# Patient Record
Sex: Male | Born: 1935 | Race: White | Hispanic: No | Marital: Married | Smoking: Former smoker
Health system: Southern US, Community
[De-identification: ages and names within clinical notes are randomized; demographics above are authoritative.]

---

## 2004-06-12 ENCOUNTER — Ambulatory Visit: Payer: Self-pay | Admitting: Rheumatology

## 2005-01-21 ENCOUNTER — Ambulatory Visit: Payer: Self-pay | Admitting: Cardiology

## 2005-05-10 ENCOUNTER — Ambulatory Visit: Payer: Self-pay | Admitting: Gastroenterology

## 2005-05-29 ENCOUNTER — Other Ambulatory Visit: Payer: Self-pay

## 2005-05-29 ENCOUNTER — Ambulatory Visit: Payer: Self-pay | Admitting: Gastroenterology

## 2005-06-05 ENCOUNTER — Ambulatory Visit: Payer: Self-pay | Admitting: Gastroenterology

## 2005-11-26 ENCOUNTER — Other Ambulatory Visit: Payer: Self-pay

## 2005-11-26 ENCOUNTER — Ambulatory Visit: Payer: Self-pay | Admitting: Urology

## 2005-12-02 ENCOUNTER — Ambulatory Visit: Payer: Self-pay | Admitting: Urology

## 2007-05-16 ENCOUNTER — Other Ambulatory Visit: Payer: Self-pay

## 2007-05-17 ENCOUNTER — Inpatient Hospital Stay: Payer: Self-pay | Admitting: Internal Medicine

## 2007-05-25 ENCOUNTER — Ambulatory Visit: Payer: Self-pay | Admitting: Internal Medicine

## 2007-08-04 ENCOUNTER — Other Ambulatory Visit: Payer: Self-pay

## 2007-08-04 ENCOUNTER — Inpatient Hospital Stay: Payer: Self-pay | Admitting: Cardiology

## 2007-08-05 ENCOUNTER — Other Ambulatory Visit: Payer: Self-pay

## 2007-11-05 ENCOUNTER — Ambulatory Visit: Payer: Self-pay | Admitting: Cardiology

## 2007-11-30 ENCOUNTER — Other Ambulatory Visit: Payer: Self-pay

## 2007-11-30 ENCOUNTER — Ambulatory Visit: Payer: Self-pay | Admitting: Cardiology

## 2009-11-26 ENCOUNTER — Inpatient Hospital Stay: Payer: Self-pay | Admitting: Internal Medicine

## 2010-01-16 ENCOUNTER — Ambulatory Visit: Payer: Self-pay | Admitting: Cardiology

## 2010-01-18 ENCOUNTER — Ambulatory Visit: Payer: Self-pay | Admitting: Cardiology

## 2010-05-03 ENCOUNTER — Inpatient Hospital Stay: Payer: Self-pay | Admitting: Internal Medicine

## 2011-02-06 ENCOUNTER — Ambulatory Visit: Payer: Self-pay | Admitting: Gastroenterology

## 2011-02-08 LAB — PATHOLOGY REPORT

## 2011-04-18 LAB — COMPREHENSIVE METABOLIC PANEL
Albumin: 4 g/dL (ref 3.4–5.0)
Alkaline Phosphatase: 53 U/L (ref 50–136)
Anion Gap: 8 (ref 7–16)
BUN: 25 mg/dL — ABNORMAL HIGH (ref 7–18)
Calcium, Total: 9.1 mg/dL (ref 8.5–10.1)
Co2: 29 mmol/L (ref 21–32)
Creatinine: 1.21 mg/dL (ref 0.60–1.30)
Glucose: 95 mg/dL (ref 65–99)
SGOT(AST): 23 U/L (ref 15–37)
SGPT (ALT): 26 U/L
Sodium: 143 mmol/L (ref 136–145)
Total Protein: 7 g/dL (ref 6.4–8.2)

## 2011-04-18 LAB — CBC WITH DIFFERENTIAL/PLATELET
Basophil %: 0.4 %
Eosinophil #: 0.2 10*3/uL (ref 0.0–0.7)
HCT: 39.6 % — ABNORMAL LOW (ref 40.0–52.0)
HGB: 13.5 g/dL (ref 13.0–18.0)
Lymphocyte #: 0.7 10*3/uL — ABNORMAL LOW (ref 1.0–3.6)
Lymphocyte %: 15 %
MCHC: 34 g/dL (ref 32.0–36.0)
MCV: 91 fL (ref 80–100)
Monocyte %: 9.4 %
Neutrophil #: 3.3 10*3/uL (ref 1.4–6.5)
RBC: 4.37 10*6/uL — ABNORMAL LOW (ref 4.40–5.90)
RDW: 15.1 % — ABNORMAL HIGH (ref 11.5–14.5)

## 2011-04-18 LAB — TROPONIN I: Troponin-I: <0.02 ng/mL

## 2011-04-18 LAB — CK TOTAL AND CKMB (NOT AT ARMC): CK, Total: 133 U/L (ref 35–232)

## 2011-04-18 LAB — PROTIME-INR
INR: 1.7
Prothrombin Time: 19.8 secs — ABNORMAL HIGH (ref 11.5–14.7)

## 2011-04-19 LAB — CK TOTAL AND CKMB (NOT AT ARMC): CK, Total: 94 U/L (ref 35–232)

## 2011-04-19 LAB — TROPONIN I: Troponin-I: <0.02 ng/mL

## 2011-04-19 LAB — TSH: Thyroid Stimulating Horm: 3.49 u[IU]/mL

## 2011-04-19 LAB — PRO B NATRIURETIC PEPTIDE: B-Type Natriuretic Peptide: 298 pg/mL (ref 0–450)

## 2011-04-20 LAB — CBC WITH DIFFERENTIAL/PLATELET
Basophil #: 0 10*3/uL (ref 0.0–0.1)
Basophil %: 0.4 %
Eosinophil #: 0.2 10*3/uL (ref 0.0–0.7)
HCT: 38.8 % — ABNORMAL LOW (ref 40.0–52.0)
HGB: 13.1 g/dL (ref 13.0–18.0)
Lymphocyte #: 0.9 10*3/uL — ABNORMAL LOW (ref 1.0–3.6)
MCH: 30.5 pg (ref 26.0–34.0)
MCHC: 33.8 g/dL (ref 32.0–36.0)
MCV: 90 fL (ref 80–100)
Monocyte %: 10.4 %
Neutrophil #: 3.6 10*3/uL (ref 1.4–6.5)
RDW: 15 % — ABNORMAL HIGH (ref 11.5–14.5)
WBC: 5.2 10*3/uL (ref 3.8–10.6)

## 2011-04-20 LAB — URINALYSIS, COMPLETE
Bacteria: NONE SEEN
Bilirubin,UR: NEGATIVE
Glucose,UR: NEGATIVE mg/dL (ref 0–75)
Leukocyte Esterase: NEGATIVE
Nitrite: NEGATIVE
RBC,UR: 1 /HPF (ref 0–5)
Squamous Epithelial: <1

## 2011-04-20 LAB — BASIC METABOLIC PANEL
Anion Gap: 9 (ref 7–16)
Calcium, Total: 9 mg/dL (ref 8.5–10.1)
Co2: 29 mmol/L (ref 21–32)
EGFR (African American): >60
EGFR (Non-African Amer.): 60 — ABNORMAL LOW
Glucose: 81 mg/dL (ref 65–99)
Osmolality: 290 (ref 275–301)
Sodium: 145 mmol/L (ref 136–145)

## 2011-04-20 LAB — CK TOTAL AND CKMB (NOT AT ARMC)
CK, Total: 72 U/L (ref 35–232)
CK, Total: 91 U/L (ref 35–232)
CK-MB: 0.7 ng/mL (ref 0.5–3.6)
CK-MB: 1.3 ng/mL (ref 0.5–3.6)

## 2011-04-21 ENCOUNTER — Inpatient Hospital Stay: Payer: Self-pay | Admitting: Internal Medicine

## 2011-04-21 LAB — BASIC METABOLIC PANEL
BUN: 21 mg/dL — ABNORMAL HIGH (ref 7–18)
Calcium, Total: 9 mg/dL (ref 8.5–10.1)
Chloride: 106 mmol/L (ref 98–107)
Co2: 29 mmol/L (ref 21–32)
Creatinine: 1.25 mg/dL (ref 0.60–1.30)
EGFR (Non-African Amer.): 60 — ABNORMAL LOW

## 2011-04-21 LAB — PROTIME-INR
INR: 2.1
Prothrombin Time: 23.8 secs — ABNORMAL HIGH (ref 11.5–14.7)

## 2011-04-26 ENCOUNTER — Ambulatory Visit: Payer: Self-pay | Admitting: Cardiology

## 2011-10-01 ENCOUNTER — Ambulatory Visit: Payer: Self-pay | Admitting: Neurology

## 2011-11-20 ENCOUNTER — Ambulatory Visit: Payer: Self-pay | Admitting: Physician Assistant

## 2013-02-20 ENCOUNTER — Emergency Department: Payer: Self-pay | Admitting: Internal Medicine

## 2013-02-20 LAB — PROTIME-INR
INR: 5.4
Prothrombin Time: 46.7 secs — ABNORMAL HIGH (ref 11.5–14.7)

## 2013-03-02 ENCOUNTER — Emergency Department: Payer: Self-pay | Admitting: Emergency Medicine

## 2013-07-31 ENCOUNTER — Inpatient Hospital Stay (HOSPITAL_COMMUNITY)
Admission: EM | Admit: 2013-07-31 | Discharge: 2013-08-03 | DRG: 068 | Disposition: A | Discharge status: Home / Self Care | Payer: Medicare Other | Attending: Internal Medicine | Admitting: Internal Medicine

## 2013-07-31 ENCOUNTER — Inpatient Hospital Stay (HOSPITAL_COMMUNITY): Payer: Medicare Other

## 2013-07-31 ENCOUNTER — Emergency Department (HOSPITAL_COMMUNITY): Payer: Medicare Other

## 2013-07-31 ENCOUNTER — Encounter (HOSPITAL_COMMUNITY): Payer: Self-pay | Admitting: Emergency Medicine

## 2013-07-31 DIAGNOSIS — I10 Essential (primary) hypertension: Secondary | ICD-10-CM

## 2013-07-31 DIAGNOSIS — I6509 Occlusion and stenosis of unspecified vertebral artery: Principal | ICD-10-CM | POA: Diagnosis present

## 2013-07-31 DIAGNOSIS — I671 Cerebral aneurysm, nonruptured: Secondary | ICD-10-CM | POA: Diagnosis present

## 2013-07-31 DIAGNOSIS — R791 Abnormal coagulation profile: Secondary | ICD-10-CM | POA: Diagnosis present

## 2013-07-31 DIAGNOSIS — E039 Hypothyroidism, unspecified: Secondary | ICD-10-CM | POA: Diagnosis present

## 2013-07-31 DIAGNOSIS — K449 Diaphragmatic hernia without obstruction or gangrene: Secondary | ICD-10-CM | POA: Diagnosis present

## 2013-07-31 DIAGNOSIS — I2789 Other specified pulmonary heart diseases: Secondary | ICD-10-CM | POA: Diagnosis present

## 2013-07-31 DIAGNOSIS — R55 Syncope and collapse: Secondary | ICD-10-CM

## 2013-07-31 DIAGNOSIS — R001 Bradycardia, unspecified: Secondary | ICD-10-CM

## 2013-07-31 DIAGNOSIS — Z860100 Personal history of colon polyps, unspecified: Secondary | ICD-10-CM

## 2013-07-31 DIAGNOSIS — M069 Rheumatoid arthritis, unspecified: Secondary | ICD-10-CM | POA: Diagnosis present

## 2013-07-31 DIAGNOSIS — I480 Paroxysmal atrial fibrillation: Secondary | ICD-10-CM | POA: Diagnosis present

## 2013-07-31 DIAGNOSIS — G4700 Insomnia, unspecified: Secondary | ICD-10-CM | POA: Diagnosis present

## 2013-07-31 DIAGNOSIS — F0390 Unspecified dementia without behavioral disturbance: Secondary | ICD-10-CM | POA: Diagnosis present

## 2013-07-31 DIAGNOSIS — Z87891 Personal history of nicotine dependence: Secondary | ICD-10-CM

## 2013-07-31 DIAGNOSIS — I729 Aneurysm of unspecified site: Secondary | ICD-10-CM | POA: Diagnosis present

## 2013-07-31 DIAGNOSIS — I2510 Atherosclerotic heart disease of native coronary artery without angina pectoris: Secondary | ICD-10-CM | POA: Diagnosis present

## 2013-07-31 DIAGNOSIS — I495 Sick sinus syndrome: Secondary | ICD-10-CM | POA: Diagnosis present

## 2013-07-31 DIAGNOSIS — I639 Cerebral infarction, unspecified: Secondary | ICD-10-CM

## 2013-07-31 DIAGNOSIS — E785 Hyperlipidemia, unspecified: Secondary | ICD-10-CM | POA: Diagnosis present

## 2013-07-31 DIAGNOSIS — I6350 Cerebral infarction due to unspecified occlusion or stenosis of unspecified cerebral artery: Secondary | ICD-10-CM

## 2013-07-31 DIAGNOSIS — I25810 Atherosclerosis of coronary artery bypass graft(s) without angina pectoris: Secondary | ICD-10-CM

## 2013-07-31 DIAGNOSIS — I440 Atrioventricular block, first degree: Secondary | ICD-10-CM | POA: Diagnosis present

## 2013-07-31 DIAGNOSIS — Z9181 History of falling: Secondary | ICD-10-CM

## 2013-07-31 DIAGNOSIS — I4891 Unspecified atrial fibrillation: Secondary | ICD-10-CM | POA: Diagnosis present

## 2013-07-31 DIAGNOSIS — Z7901 Long term (current) use of anticoagulants: Secondary | ICD-10-CM

## 2013-07-31 DIAGNOSIS — R471 Dysarthria and anarthria: Secondary | ICD-10-CM | POA: Diagnosis present

## 2013-07-31 LAB — RAPID URINE DRUG SCREEN, HOSP PERFORMED
Amphetamines: NOT DETECTED
Barbiturates: NOT DETECTED
Benzodiazepines: NOT DETECTED
Cocaine: NOT DETECTED
Opiates: NOT DETECTED
Tetrahydrocannabinol: NOT DETECTED

## 2013-07-31 LAB — URINALYSIS, ROUTINE W REFLEX MICROSCOPIC
Bilirubin Urine: NEGATIVE
Bilirubin Urine: NEGATIVE
Glucose, UA: NEGATIVE mg/dL
Glucose, UA: NEGATIVE mg/dL
Hgb urine dipstick: NEGATIVE
Hgb urine dipstick: NEGATIVE
Ketones, ur: NEGATIVE mg/dL
Ketones, ur: 15 mg/dL — AB
LEUKOCYTES UA: NEGATIVE
NITRITE: NEGATIVE
Nitrite: NEGATIVE
Protein, ur: NEGATIVE mg/dL
Protein, ur: NEGATIVE mg/dL
SPECIFIC GRAVITY, URINE: 1.015 (ref 1.005–1.030)
Specific Gravity, Urine: 1.013 (ref 1.005–1.030)
Urobilinogen, UA: 0.2 mg/dL (ref 0.0–1.0)
Urobilinogen, UA: 0.2 mg/dL (ref 0.0–1.0)
pH: 6.5 (ref 5.0–8.0)
pH: 6.5 (ref 5.0–8.0)

## 2013-07-31 LAB — CBC
HCT: 36.9 % — ABNORMAL LOW (ref 39.0–52.0)
Hemoglobin: 12.2 g/dL — ABNORMAL LOW (ref 13.0–17.0)
MCH: 30.1 pg (ref 26.0–34.0)
MCHC: 33.1 g/dL (ref 30.0–36.0)
MCV: 91.1 fL (ref 78.0–100.0)
Platelets: 140 10*3/uL — ABNORMAL LOW (ref 150–400)
RBC: 4.05 MIL/uL — ABNORMAL LOW (ref 4.22–5.81)
RDW: 14.4 % (ref 11.5–15.5)
WBC: 4.2 10*3/uL (ref 4.0–10.5)

## 2013-07-31 LAB — I-STAT CHEM 8, ED
BUN: 22 mg/dL (ref 6–23)
CALCIUM ION: 1.19 mmol/L (ref 1.13–1.30)
CREATININE: 1.2 mg/dL (ref 0.50–1.35)
Chloride: 105 mEq/L (ref 96–112)
Glucose, Bld: 81 mg/dL (ref 70–99)
HCT: 34 % — ABNORMAL LOW (ref 39.0–52.0)
Hemoglobin: 11.6 g/dL — ABNORMAL LOW (ref 13.0–17.0)
Potassium: 3.8 mEq/L (ref 3.7–5.3)
SODIUM: 141 meq/L (ref 137–147)
TCO2: 26 mmol/L (ref 0–100)

## 2013-07-31 LAB — DIFFERENTIAL
Basophils Absolute: 0 10*3/uL (ref 0.0–0.1)
Basophils Relative: 0 % (ref 0–1)
Eosinophils Absolute: 0.1 10*3/uL (ref 0.0–0.7)
Eosinophils Relative: 3 % (ref 0–5)
Lymphocytes Relative: 17 % (ref 12–46)
Lymphs Abs: 0.7 10*3/uL (ref 0.7–4.0)
Monocytes Absolute: 0.4 10*3/uL (ref 0.1–1.0)
Monocytes Relative: 10 % (ref 3–12)
Neutro Abs: 3 10*3/uL (ref 1.7–7.7)
Neutrophils Relative %: 70 % (ref 43–77)

## 2013-07-31 LAB — GLUCOSE, CAPILLARY
GLUCOSE-CAPILLARY: 96 mg/dL (ref 70–99)
GLUCOSE-CAPILLARY: 103 mg/dL — AB (ref 70–99)

## 2013-07-31 LAB — COMPREHENSIVE METABOLIC PANEL
ALT: 26 U/L (ref 0–53)
AST: 21 U/L (ref 0–37)
Albumin: 3.5 g/dL (ref 3.5–5.2)
Alkaline Phosphatase: 66 U/L (ref 39–117)
BUN: 24 mg/dL — ABNORMAL HIGH (ref 6–23)
CO2: 24 mEq/L (ref 19–32)
Calcium: 9 mg/dL (ref 8.4–10.5)
Chloride: 106 mEq/L (ref 96–112)
Creatinine, Ser: 1.11 mg/dL (ref 0.50–1.35)
GFR calc Af Amer: 71 mL/min — ABNORMAL LOW (ref >90)
GFR calc non Af Amer: 61 mL/min — ABNORMAL LOW (ref >90)
Glucose, Bld: 83 mg/dL (ref 70–99)
Potassium: 4 mEq/L (ref 3.7–5.3)
Sodium: 143 mEq/L (ref 137–147)
Total Bilirubin: 0.3 mg/dL (ref 0.3–1.2)
Total Protein: 6.2 g/dL (ref 6.0–8.3)

## 2013-07-31 LAB — CBG MONITORING, ED: GLUCOSE-CAPILLARY: 82 mg/dL (ref 70–99)

## 2013-07-31 LAB — MAGNESIUM: Magnesium: 1.8 mg/dL (ref 1.5–2.5)

## 2013-07-31 LAB — I-STAT TROPONIN, ED: Troponin i, poc: 0 ng/mL (ref 0.00–0.08)

## 2013-07-31 LAB — TROPONIN I
Troponin I: <0.3 ng/mL (ref <0.30)
Troponin I: <0.3 ng/mL (ref <0.30)

## 2013-07-31 LAB — URINE MICROSCOPIC-ADD ON

## 2013-07-31 LAB — TSH: TSH: 0.888 u[IU]/mL (ref 0.350–4.500)

## 2013-07-31 LAB — APTT: aPTT: 42 seconds — ABNORMAL HIGH (ref 24–37)

## 2013-07-31 LAB — PRO B NATRIURETIC PEPTIDE: Pro B Natriuretic peptide (BNP): 357.7 pg/mL (ref 0–450)

## 2013-07-31 LAB — PROTIME-INR
INR: 2.71 — ABNORMAL HIGH (ref 0.00–1.49)
Prothrombin Time: 27.8 seconds — ABNORMAL HIGH (ref 11.6–15.2)

## 2013-07-31 MED ORDER — ENOXAPARIN SODIUM 40 MG/0.4ML ~~LOC~~ SOLN: 40.0000 mg | SUBCUTANEOUS | Status: DC

## 2013-07-31 MED ORDER — ZOLPIDEM TARTRATE 5 MG PO TABS
5.0000 mg | ORAL_TABLET | Freq: Every day | ORAL | Status: DC
  Administered 2013-07-31 – 2013-08-02 (×3): 5 mg via ORAL
  Filled 2013-07-31 (×3): qty 1

## 2013-07-31 MED ORDER — LORATADINE 10 MG PO TABS
10.0000 mg | ORAL_TABLET | Freq: Every day | ORAL | Status: DC
  Administered 2013-07-31 – 2013-08-02 (×3): 10 mg via ORAL
  Filled 2013-07-31 (×4): qty 1

## 2013-07-31 MED ORDER — AMLODIPINE BESYLATE 10 MG PO TABS
10.0000 mg | ORAL_TABLET | Freq: Every day | ORAL | Status: DC
  Administered 2013-08-01 – 2013-08-03 (×3): 10 mg via ORAL
  Filled 2013-07-31 (×4): qty 1

## 2013-07-31 MED ORDER — WARFARIN - PHARMACIST DOSING INPATIENT
Freq: Every day | Status: DC
  Administered 2013-07-31: 19:00:00

## 2013-07-31 MED ORDER — LEVOTHYROXINE SODIUM 175 MCG PO TABS
175.0000 ug | ORAL_TABLET | Freq: Every day | ORAL | Status: DC
  Administered 2013-08-01 – 2013-08-03 (×3): 175 ug via ORAL
  Filled 2013-07-31 (×5): qty 1

## 2013-07-31 MED ORDER — ASPIRIN 325 MG PO TABS
325.0000 mg | ORAL_TABLET | Freq: Every day | ORAL | Status: DC
  Administered 2013-07-31: 325 mg via ORAL
  Filled 2013-07-31: qty 1

## 2013-07-31 MED ORDER — CELECOXIB 50 MG PO CAPS
50.0000 mg | ORAL_CAPSULE | ORAL | Status: DC
  Filled 2013-07-31: qty 1

## 2013-07-31 MED ORDER — OXYCODONE-ACETAMINOPHEN 5-325 MG PO TABS
1.0000 | ORAL_TABLET | Freq: Four times a day (QID) | ORAL | Status: DC | PRN
Start: 2013-07-31 — End: 2013-08-03

## 2013-07-31 MED ORDER — HYDRALAZINE HCL 20 MG/ML IJ SOLN: 2.0000 mg | Freq: Four times a day (QID) | INTRAMUSCULAR | Status: DC | PRN

## 2013-07-31 MED ORDER — LOSARTAN POTASSIUM 50 MG PO TABS
100.0000 mg | ORAL_TABLET | Freq: Every day | ORAL | Status: DC
  Administered 2013-08-01 – 2013-08-03 (×3): 100 mg via ORAL
  Filled 2013-07-31 (×5): qty 2

## 2013-07-31 MED ORDER — THIAMINE HCL 100 MG PO TABS
100.0000 mg | ORAL_TABLET | Freq: Every evening | ORAL | Status: DC
  Administered 2013-07-31 – 2013-08-02 (×3): 100 mg via ORAL
  Filled 2013-07-31 (×4): qty 1

## 2013-07-31 MED ORDER — CYANOCOBALAMIN 500 MCG PO TABS
500.0000 ug | ORAL_TABLET | Freq: Every evening | ORAL | Status: DC
  Administered 2013-07-31 – 2013-08-02 (×3): 500 ug via ORAL
  Filled 2013-07-31 (×4): qty 1

## 2013-07-31 MED ORDER — TRAZODONE HCL 50 MG PO TABS: 50.0000 mg | ORAL_TABLET | Freq: Every day | ORAL | Status: DC

## 2013-07-31 MED ORDER — DUTASTERIDE-TAMSULOSIN HCL 0.5-0.4 MG PO CAPS: 1.0000 | ORAL_CAPSULE | Freq: Every evening | ORAL | Status: DC

## 2013-07-31 MED ORDER — MONTELUKAST SODIUM 10 MG PO TABS
10.0000 mg | ORAL_TABLET | Freq: Every day | ORAL | Status: DC
  Administered 2013-07-31 – 2013-08-02 (×3): 10 mg via ORAL
  Filled 2013-07-31 (×4): qty 1

## 2013-07-31 MED ORDER — TAMSULOSIN HCL 0.4 MG PO CAPS
0.4000 mg | ORAL_CAPSULE | Freq: Every day | ORAL | Status: DC
  Administered 2013-08-01 – 2013-08-02 (×2): 0.4 mg via ORAL
  Filled 2013-07-31 (×4): qty 1

## 2013-07-31 MED ORDER — ASPIRIN 300 MG RE SUPP: 300.0000 mg | Freq: Every day | RECTAL | Status: DC

## 2013-07-31 MED ORDER — ATORVASTATIN CALCIUM 20 MG PO TABS
20.0000 mg | ORAL_TABLET | Freq: Every evening | ORAL | Status: DC
  Administered 2013-07-31 – 2013-08-02 (×3): 20 mg via ORAL
  Filled 2013-07-31 (×4): qty 1

## 2013-07-31 MED ORDER — SODIUM CHLORIDE 0.9 % IV SOLN
INTRAVENOUS | Status: DC
  Administered 2013-07-31 – 2013-08-02 (×3): via INTRAVENOUS

## 2013-07-31 MED ORDER — DONEPEZIL HCL 10 MG PO TABS
10.0000 mg | ORAL_TABLET | Freq: Every day | ORAL | Status: DC
  Administered 2013-07-31 – 2013-08-02 (×3): 10 mg via ORAL
  Filled 2013-07-31 (×4): qty 1

## 2013-07-31 MED ORDER — DUTASTERIDE 0.5 MG PO CAPS
0.5000 mg | ORAL_CAPSULE | Freq: Every evening | ORAL | Status: DC
  Administered 2013-08-01 – 2013-08-02 (×2): 0.5 mg via ORAL
  Filled 2013-07-31 (×4): qty 1

## 2013-07-31 MED ORDER — WARFARIN SODIUM 3 MG PO TABS
3.5000 mg | ORAL_TABLET | Freq: Every day | ORAL | Status: DC
  Administered 2013-07-31: 3.5 mg via ORAL
  Filled 2013-07-31 (×2): qty 1

## 2013-07-31 MED ORDER — SENNOSIDES-DOCUSATE SODIUM 8.6-50 MG PO TABS
1.0000 | ORAL_TABLET | Freq: Every evening | ORAL | Status: DC | PRN
  Filled 2013-07-31: qty 1

## 2013-07-31 MED ORDER — TRAZODONE HCL 100 MG PO TABS
200.0000 mg | ORAL_TABLET | Freq: Every day | ORAL | Status: DC
  Administered 2013-07-31 – 2013-08-02 (×3): 200 mg via ORAL
  Filled 2013-07-31 (×4): qty 2

## 2013-07-31 MED ORDER — FOLIC ACID 1 MG PO TABS
1.0000 mg | ORAL_TABLET | Freq: Every day | ORAL | Status: DC
  Administered 2013-07-31 – 2013-08-03 (×4): 1 mg via ORAL
  Filled 2013-07-31 (×5): qty 1

## 2013-07-31 NOTE — ED Notes (Addendum)
Wife reported Pt was last seen normal at 0845. Wife reported Pt was cooking waffles when she heard Pt develop slurred speech  At 0930. Wife then called 911

## 2013-07-31 NOTE — H&P (Addendum)
[facility] History and Physical  Charles Berg WIO:[id number] DOB: January 06, 1932 DOA: 07/31/2013  Referring physician:  PCP: Daniel Nones  Specialists:   Chief Complaint: Slurred speech, ataxia per wife  HPI: Charles Berg is a 80 y.o. male WM PMHx symptomatic bradycardia with pacer placement 2012/remove 2 weeks later secondary to infection, S/P subcutaneous loop recorder. States since 2012 has been on rate control medication amiodarone 200 mg BID, without side effect. On further questioning the daughter and wife admit that on numerous occasions patient has had presyncope and had to sit down, and has had one recent occurrence of syncope. Review of care everywhere; shows patient was last seen by Dr. Gerre Pebbles electrophysiology [facility] January 2010 at which time he was believed to be suffering from sick sinus syndrome. At that time patient's Cardizem and Toprol were discontinued and patient was continued on amiodarone for rhythm control. Was felt at that time patient may need a pacemaker instead of atrial fibrillation ablation. Presented to the emergency department with acute onset of difficulty speaking abnormal gait while cooking breakfast this morning. The patient, states, that he does not feel, weakness, in his extremities, but and had trouble walking and speaking. Patient, states, that the chest pain, shortness of breath, nausea, vomiting, diarrhea, headache, blurred vision, dizziness, fever, abdominal pain, or syncope. The patient states, that he has not had a stroke in the past. The patient does have some confusion as well. Patient wife provided a list showing that patient S/P cardioversion 08/05/2007 and 01/30/2008, S/P CardioNet monitor January 2010 and 06/08/2010      Review of Systems: The patient denies anorexia, fever, weight loss,, vision loss, decreased hearing, hoarseness, chest pain,  peripheral edema, balance deficits, hemoptysis, abdominal pain,  melena, hematochezia, severe indigestion/heartburn, hematuria, incontinence, genital sores, muscle weakness, suspicious skin lesions, transient blindness, difficulty walking, depression, unusual weight change, abnormal bleeding, enlarged lymph nodes, angioedema, and breast masses.    TRAVEL HISTORY:   Procedure MRI brain without contrast 07/31/2013 -No acute infarct.  - Likely Slow flow/occlusion of distal left vertebral artery.   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   Per care everywhere Dr. Gerre Pebbles (electrophysiology [facility])  October 2006 cardiac catheterization 40% LAD, 40% RCA, and 25% lesions in other vessels.   Antibiotics   Consultation    History reviewed. No pertinent past medical history. History reviewed. No pertinent past surgical history. Social History:  reports that he has quit smoking. He uses smokeless tobacco. He reports that he does not drink alcohol or use illicit drugs.  where does patient live--home, ALF, SNF? Home with wife   Allergies  Allergen Reactions  . Aspirin Other (See Comments)    Yeast infection in mouth  . Sulfa Antibiotics Other (See Comments)    unknown  . Penicillins Rash    History reviewed. No pertinent family history.   Prior to Admission medications   Not on File   Physical Exam: Filed Vitals:   07/31/13 1315 07/31/13 1330 07/31/13 1557 07/31/13 1749  BP: 153/62 152/60 156/71 138/61  Pulse: 50 49 50 44  Temp:   97.4 F (36.3 C) 97.5 F (36.4 C)  TempSrc:   Oral Oral  Resp: 15 14  16   Height:      Weight:      SpO2:    95%     General:  A./O. x4, NAD  Eyes: Pupils equal round reactive to light  and accommodation  Neck: Negative JVD, negative lymphadenopathy  Cardiovascular: Sinus bradycardia, negative murmurs rubs or gallops  Respiratory: Clear to auscultation bilateral  Abdomen: Soft, nontender, nondistended, plus bowel  sounds  Musculoskeletal: Implantable loop recorder present left upper chest wall  Neurologic: Cranial nerves II through XII intact, tongue/uvula midline, extremity strength 5/5, sensation intact throughout, negative Romberg, negative pronator drift, finger nose finger within normal limits bilateral, quick finger touchs within normal limits bilateral, balance on one leg within normal limits bilateral, ambulation within normal limits, ambulation on toe/heel within normal limit   Labs on Admission:  Basic Metabolic Panel:  Recent Labs Lab 07/31/13 1055 07/31/13 1100  NA 141 143  K 3.8 4.0  CL 105 106  CO2  --  24  GLUCOSE 81 83  BUN 22 24*  CREATININE 1.20 1.11  CALCIUM  --  9.0   Liver Function Tests:  Recent Labs Lab 07/31/13 1100  AST 21  ALT 26  ALKPHOS 66  BILITOT 0.3  PROT 6.2  ALBUMIN 3.5   No results found for this basename: LIPASE, AMYLASE,  in the last 168 hours No results found for this basename: AMMONIA,  in the last 168 hours CBC:  Recent Labs Lab 07/31/13 1055 07/31/13 1100  WBC  --  4.2  NEUTROABS  --  3.0  HGB 11.6* 12.2*  HCT 34.0* 36.9*  MCV  --  91.1  PLT  --  140*   Cardiac Enzymes: No results found for this basename: CKTOTAL, CKMB, CKMBINDEX, TROPONINI,  in the last 168 hours  BNP (last 3 results) No results found for this basename: PROBNP,  in the last 8760 hours CBG:  Recent Labs Lab 07/31/13 1100  GLUCAP 82    Radiological Exams on Admission: Dg Chest 2 View  07/31/2013   CLINICAL DATA:  Stroke.  EXAM: CHEST  2 VIEW  COMPARISON:  None.  FINDINGS: Mild hyperinflation. Lateral view degraded by patient arm position. Borderline loss of thoracic vertebral body height at multiple levels. Favored to be within normal variation.  Telemetry device projects over the left side of the mediastinum on the frontal.  Patient rotated right. Midline trachea. Normal heart size and mediastinal contours for age. No pleural effusion or pneumothorax.  Biapical pleural parenchymal scarring. Mild volume loss at the left lung base.  IMPRESSION: Hyperinflation and pleural parenchymal scarring.  No acute findings.   Electronically Signed   By: Jeronimo Greaves M.D.   On: 07/31/2013 15:23   Ct Head Wo Contrast  07/31/2013   CLINICAL DATA:  80 year old male code stroke. Left side facial droop. Initial encounter.  EXAM: CT HEAD WITHOUT CONTRAST  TECHNIQUE: Contiguous axial images were obtained from the base of the skull through the vertex without intravenous contrast.  COMPARISON:  None.  FINDINGS: No acute osseous abnormality identified. Visualized orbits and scalp soft tissues are within normal limits. Visualized paranasal sinuses and mastoids are clear.  Calcified atherosclerosis at the skull base. Cerebral volume is within normal limits for age. No ventriculomegaly. No midline shift, mass effect, or evidence of intracranial mass lesion. No suspicious intracranial vascular hyperdensity. No evidence of cortically based acute infarction identified. Gray-white matter differentiation is within normal limits throughout the brain. No acute intracranial hemorrhage identified.  IMPRESSION: Normal for age non contrast CT appearance of the brain.  Study discussed by telephone with Dr. Ritta Slot on 07/31/2013 at 11:04 .   Electronically Signed   By: Augusto Gamble M.D.   On: 07/31/2013 11:04  Mr Shirlee Latch Wo Contrast  07/31/2013   CLINICAL DATA:  80 year old male with slurred speech and facial droop. Difficulty walking. Code stroke.  EXAM: MRI HEAD WITHOUT CONTRAST  MRA HEAD WITHOUT CONTRAST  TECHNIQUE: Multiplanar, multiecho pulse sequences of the brain and surrounding structures were obtained without intravenous contrast. Angiographic images of the head were obtained using MRA technique without contrast.  COMPARISON:  Non contrast head CT 1048 hr the same day.  FINDINGS: MRI HEAD FINDINGS  Cerebral volume is within normal limits for age. No restricted diffusion to  suggest acute infarction. No midline shift, mass effect, evidence of mass lesion, ventriculomegaly, extra-axial collection or acute intracranial hemorrhage. Cervicomedullary junction and pituitary are within normal limits.  Decreased distal left vertebral artery flow void (series 11, image 14). Other Major intracranial vascular flow voids are preserved.  Small focus of chronic blood products in the right parietal lobe (series 9, image 121) without associated T2 or FLAIR hyperintensity. Elsewhere normal for age gray and white matter throughout the brain.  Negative visualized cervical spine. Normal bone marrow signal. Visualized orbit soft tissues are within normal limits. Minor paranasal sinus mucosal thickening. Mastoids are clear. Visible internal auditory structures appear normal. Visualized scalp soft tissues are within normal limits.  MRA HEAD FINDINGS  Absent antegrade flow in the distal left vertebral artery, instilled just before the vertebrobasilar junction. Preserved antegrade flow in the distal right vertebral artery. Normal right PICA origin.  The basilar artery is patent without stenosis. SCA and PCA origins are normal. Posterior communicating arteries are diminutive or absent. Bilateral PCA branches are within normal limits.  Antegrade flow in both ICA siphons. Tortuous distal cervical left ICA. No ICA stenosis. Normal ophthalmic artery origins. Unusual fenestrated appearance of the left ACA A1 segment. Normal carotid termini. Normal MCA and ACA origins otherwise. Anterior communicating artery remarkable for a 3 mm aneurysm directed inferiorly (series 5, images 89 and 90).  Other visualized ACA branches are within normal limits. Visualized bilateral MCA branches are within normal limits.  IMPRESSION: 1. No acute infarct. 2. 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 Favor chronic in light of no signal changes in the left cerebellum. 3. 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 4. Negative MRI appearance of the brain parenchyma aside from a small focus of chronic hemorrhage in the right parietal lobe. Study discussed by telephone with Dr. Ritta Slot on 07/31/2013 at 15:05 .   Electronically Signed   By: Augusto Gamble M.D.   On: 07/31/2013 15:05   Mr Brain Wo Contrast  07/31/2013   CLINICAL DATA:  80 year old male with slurred speech and facial droop. Difficulty walking. Code stroke.  EXAM: MRI HEAD WITHOUT CONTRAST  MRA HEAD WITHOUT CONTRAST  TECHNIQUE: Multiplanar, multiecho pulse sequences of the brain and surrounding structures were obtained without intravenous contrast. Angiographic images of the head were obtained using MRA technique without contrast.  COMPARISON:  Non contrast head CT 1048 hr the same day.  FINDINGS: MRI HEAD FINDINGS  Cerebral volume is within normal limits for age. No restricted diffusion to suggest acute infarction. No midline shift, mass effect, evidence of mass lesion, ventriculomegaly, extra-axial collection or acute intracranial hemorrhage. Cervicomedullary junction and pituitary are within normal limits.  Decreased distal left vertebral artery flow void (series 11, image 14). Other Major intracranial vascular flow voids are preserved.  Small focus of chronic blood products in the right parietal lobe (series 9, image 121)  without associated T2 or FLAIR hyperintensity. Elsewhere normal for age gray and white matter throughout the brain.  Negative visualized cervical spine. Normal bone marrow signal. Visualized orbit soft tissues are within normal limits. Minor paranasal sinus mucosal thickening. Mastoids are clear. Visible internal auditory structures appear normal. Visualized scalp soft tissues are within normal limits.  MRA HEAD FINDINGS  Absent antegrade flow in the distal left vertebral artery, instilled just before the vertebrobasilar junction. Preserved antegrade flow in the distal right vertebral  artery. Normal right PICA origin.  The basilar artery is patent without stenosis. SCA and PCA origins are normal. Posterior communicating arteries are diminutive or absent. Bilateral PCA branches are within normal limits.  Antegrade flow in both ICA siphons. Tortuous distal cervical left ICA. No ICA stenosis. Normal ophthalmic artery origins. Unusual fenestrated appearance of the left ACA A1 segment. Normal carotid termini. Normal MCA and ACA origins otherwise. Anterior communicating artery remarkable for a 3 mm aneurysm directed inferiorly (series 5, images 89 and 90).  Other visualized ACA branches are within normal limits. Visualized bilateral MCA branches are within normal limits.  IMPRESSION: 1. No acute infarct. 2. 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 Favor chronic in light of no signal changes in the left cerebellum. 3. 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 4. Negative MRI appearance of the brain parenchyma aside from a small focus of chronic hemorrhage in the right parietal lobe. Study discussed by telephone with Dr. Ritta Slot on 07/31/2013 at 15:05 .   Electronically Signed   By: Augusto Gamble M.D.   On: 07/31/2013 15:05    EKG: Sinus bradycardia, first degree AV block, wide QRS, prolonged QT interval, RAD, Q waves 1, aVL (lateral infarct)  NOTE; care everywhere 04/24/2008 Dr. Gerre Pebbles reported his EKG showing, NSR, rate 36 with premature atrial contraction and no ST-T wave changes.    Assessment/Plan Active Problems:   Stroke   HTN (hypertension)   HLD (hyperlipidemia)   Rheumatoid arthritis   Unspecified hypothyroidism   Hiatal hernia   Personal history of colonic polyps   CAD (coronary artery disease) of artery bypass graft   Paroxysmal atrial fibrillation   Insomnia   Sick sinus syndrome   Aneurysm of unspecified site   Sick sinus syndrome? -Spoke with Dr. Anne Ng (cardiology)  recommended stopping Amiodarone, and agreed that patient probably will need a pacer  Paroxysmal atrial fibrillation -Patient currently in sinus bradycardia, with first degree AV block and wide QRS, prolonged QT interval -Continue Coumadin per pharmacy  HTN -Continue amlodipine 10 mg -Continue losartan 100 mg -Hydralazine IV when necessary SBP>160 or DBP> 100  Hypothyroidism -Obtain TSH and free T4  Coronary artery disease -Obtain troponin x3 -Obtain proBNP -Echocardiogram ordered  HLD -Obtain lipid panel  Rheumatoid arthritis -Continue patient's Percocet 5/325 mg Q6 hr PRN pain -Will hold gabapentin as patient and wife unsure why she is on this medication  Insomnia -Continue trazodone 200 mg QHS  Aneurysm -Reconsult neurology in the a.m.    Code Status: Full Family Communication: Family at bedside  Disposition Plan: Completion of cardiology work up   Time spent: 140 min   Drema Dallas [facility] Pager [phone]  If 7PM-7AM, please contact night-coverage [email or web address] Password [facility] 07/31/2013, 6:31 PM

## 2013-07-31 NOTE — Consult Note (Signed)
Reason for Consult: Bradycardia Referring Physician: [name] is an 80 y.o. male.  HPI: Charles Berg is a 80 year old male with a history of atrial fibrillation and hypertension. He presents after an episode of acute encephalopathy. This morning he was in his usual state of health. He was making waffles with his wife when he began to make some jerking motions with his arms and seemed unsteady on his feet. He did not speak. He did not pass out. His wife walked him into the next room and sat him in a chair. After several minutes where he was not responsive he began to speak again but had slurred speech. His wife was concerned about stroke. He was taken to the hospital. He had an MRI that showed some older strokes and was admitted to the neurology service. Charles Berg memory is blurry around the time of the event. He doesn't remember any heart palpitations.  He has not had a similar episode in the past, but did have a syncopal episode several years ago while standing in line at the bank. There was a prodrome during that event. He does have a past medical history of falls. He states that these are mechanical in nature. They generally occur when he is walking up or down stairs. He has not had a fall when walking on level ground.   He at one time 3 years ago had a pacemaker placed for slow heart rates. It became infected within 2 weeks and was removed. He was then referred to Dr Marcello Moores at [facility] who did not believe that he needed a pacemaker. He had a Medtronic Reveal placed instead. He is treated with amiodarone for his atrial fibrillation as well as coumadin for stroke prevention.    Since arrival here his heart rate has been as low as 38. He has remained asymptomatic.   History reviewed. No pertinent past medical history. -atrial fibrillation -bradycardia -pacemaker pocket infection -falls -mild non-obstructive CAD -cva  History reviewed. No pertinent past surgical history.  Cath  2006  History reviewed. No pertinent family history. No history of CAD, SCD  Social History:  reports that he has quit smoking. He uses smokeless tobacco. He reports that he does not drink alcohol or use illicit drugs.  Allergies:  Allergies  Allergen Reactions  . Aspirin Other (See Comments)    Yeast infection in mouth  . Sulfa Antibiotics Other (See Comments)    unknown  . Penicillins Rash    Medications: I have reviewed the patient's current medications.  Results for orders placed during the hospital encounter of 07/31/13 (from the past 48 hour(s))  I-STAT CHEM 8, ED     Status: Abnormal   Collection Time    07/31/13 10:55 AM      Result Value Ref Range   Sodium 141  137 - 147 mEq/L   Potassium 3.8  3.7 - 5.3 mEq/L   Chloride 105  96 - 112 mEq/L   BUN 22  6 - 23 mg/dL   Creatinine, Ser 1.20  0.50 - 1.35 mg/dL   Glucose, Bld 81  70 - 99 mg/dL   Calcium, Ion 1.19  1.13 - 1.30 mmol/L   TCO2 26  0 - 100 mmol/L   Hemoglobin 11.6 (*) 13.0 - 17.0 g/dL   HCT 34.0 (*) 39.0 - 52.0 %  CBG MONITORING, ED     Status: None   Collection Time    07/31/13 11:00 AM  Result Value Ref Range   Glucose-Capillary 82  70 - 99 mg/dL   Comment 1 Documented in Chart     Comment 2 Notify RN    PROTIME-INR     Status: Abnormal   Collection Time    07/31/13 11:00 AM      Result Value Ref Range   Prothrombin Time 27.8 (*) 11.6 - 15.2 seconds   INR 2.71 (*) 0.00 - 1.49  APTT     Status: Abnormal   Collection Time    07/31/13 11:00 AM      Result Value Ref Range   aPTT 42 (*) 24 - 37 seconds   Comment:            IF BASELINE aPTT IS ELEVATED,     SUGGEST PATIENT RISK ASSESSMENT     BE USED TO DETERMINE APPROPRIATE     ANTICOAGULANT THERAPY.  CBC     Status: Abnormal   Collection Time    07/31/13 11:00 AM      Result Value Ref Range   WBC 4.2  4.0 - 10.5 K/uL   RBC 4.05 (*) 4.22 - 5.81 MIL/uL   Hemoglobin 12.2 (*) 13.0 - 17.0 g/dL   HCT 36.9 (*) 39.0 - 52.0 %   MCV 91.1  78.0 -  100.0 fL   MCH 30.1  26.0 - 34.0 pg   MCHC 33.1  30.0 - 36.0 g/dL   RDW 14.4  11.5 - 15.5 %   Platelets 140 (*) 150 - 400 K/uL  DIFFERENTIAL     Status: None   Collection Time    07/31/13 11:00 AM      Result Value Ref Range   Neutrophils Relative % 70  43 - 77 %   Neutro Abs 3.0  1.7 - 7.7 K/uL   Lymphocytes Relative 17  12 - 46 %   Lymphs Abs 0.7  0.7 - 4.0 K/uL   Monocytes Relative 10  3 - 12 %   Monocytes Absolute 0.4  0.1 - 1.0 K/uL   Eosinophils Relative 3  0 - 5 %   Eosinophils Absolute 0.1  0.0 - 0.7 K/uL   Basophils Relative 0  0 - 1 %   Basophils Absolute 0.0  0.0 - 0.1 K/uL  COMPREHENSIVE METABOLIC PANEL     Status: Abnormal   Collection Time    07/31/13 11:00 AM      Result Value Ref Range   Sodium 143  137 - 147 mEq/L   Potassium 4.0  3.7 - 5.3 mEq/L   Chloride 106  96 - 112 mEq/L   CO2 24  19 - 32 mEq/L   Glucose, Bld 83  70 - 99 mg/dL   BUN 24 (*) 6 - 23 mg/dL   Creatinine, Ser 1.11  0.50 - 1.35 mg/dL   Calcium 9.0  8.4 - 10.5 mg/dL   Total Protein 6.2  6.0 - 8.3 g/dL   Albumin 3.5  3.5 - 5.2 g/dL   AST 21  0 - 37 U/L   ALT 26  0 - 53 U/L   Alkaline Phosphatase 66  39 - 117 U/L   Total Bilirubin 0.3  0.3 - 1.2 mg/dL   GFR calc non Af Amer 61 (*) >90 mL/min   GFR calc Af Amer 71 (*) >90 mL/min   Comment: (NOTE)     The eGFR has been calculated using the CKD EPI equation.     This calculation has not been validated in  all clinical situations.     eGFR's persistently <90 mL/min signify possible Chronic Kidney     Disease.  Randolm Idol, ED     Status: None   Collection Time    07/31/13 11:08 AM      Result Value Ref Range   Troponin i, poc 0.00  0.00 - 0.08 ng/mL   Comment 3            Comment: Due to the release kinetics of cTnI,     a negative result within the first hours     of the onset of symptoms does not rule out     myocardial infarction with certainty.     If myocardial infarction is still suspected,     repeat the test at appropriate  intervals.  URINE RAPID DRUG SCREEN (HOSP PERFORMED)     Status: None   Collection Time    07/31/13 12:29 PM      Result Value Ref Range   Opiates NONE DETECTED  NONE DETECTED   Cocaine NONE DETECTED  NONE DETECTED   Benzodiazepines NONE DETECTED  NONE DETECTED   Amphetamines NONE DETECTED  NONE DETECTED   Tetrahydrocannabinol NONE DETECTED  NONE DETECTED   Barbiturates NONE DETECTED  NONE DETECTED   Comment:            DRUG SCREEN FOR MEDICAL PURPOSES     ONLY.  IF CONFIRMATION IS NEEDED     FOR ANY PURPOSE, NOTIFY LAB     WITHIN 5 DAYS.                LOWEST DETECTABLE LIMITS     FOR URINE DRUG SCREEN     Drug Class       Cutoff (ng/mL)     Amphetamine      1000     Barbiturate      200     Benzodiazepine   163     Tricyclics       845     Opiates          300     Cocaine          300     THC              50  URINALYSIS, ROUTINE W REFLEX MICROSCOPIC     Status: Abnormal   Collection Time    07/31/13 12:29 PM      Result Value Ref Range   Color, Urine YELLOW  YELLOW   APPearance CLEAR  CLEAR   Specific Gravity, Urine 1.013  1.005 - 1.030   pH 6.5  5.0 - 8.0   Glucose, UA NEGATIVE  NEGATIVE mg/dL   Hgb urine dipstick NEGATIVE  NEGATIVE   Bilirubin Urine NEGATIVE  NEGATIVE   Ketones, ur NEGATIVE  NEGATIVE mg/dL   Protein, ur NEGATIVE  NEGATIVE mg/dL   Urobilinogen, UA 0.2  0.0 - 1.0 mg/dL   Nitrite NEGATIVE  NEGATIVE   Leukocytes, UA TRACE (*) NEGATIVE  URINE MICROSCOPIC-ADD ON     Status: Abnormal   Collection Time    07/31/13 12:29 PM      Result Value Ref Range   Squamous Epithelial / LPF FEW (*) RARE   WBC, UA 3-6  <3 WBC/hpf   Bacteria, UA RARE  RARE  TROPONIN I     Status: None   Collection Time    07/31/13  5:12 PM      Result Value Ref Range  Troponin I <0.30  <0.30 ng/mL   Comment:            Due to the release kinetics of cTnI,     a negative result within the first hours     of the onset of symptoms does not rule out     myocardial infarction with  certainty.     If myocardial infarction is still suspected,     repeat the test at appropriate intervals.  GLUCOSE, CAPILLARY     Status: None   Collection Time    07/31/13  6:23 PM      Result Value Ref Range   Glucose-Capillary 96  70 - 99 mg/dL  URINALYSIS, ROUTINE W REFLEX MICROSCOPIC     Status: Abnormal   Collection Time    07/31/13  7:11 PM      Result Value Ref Range   Color, Urine YELLOW  YELLOW   APPearance CLEAR  CLEAR   Specific Gravity, Urine 1.015  1.005 - 1.030   pH 6.5  5.0 - 8.0   Glucose, UA NEGATIVE  NEGATIVE mg/dL   Hgb urine dipstick NEGATIVE  NEGATIVE   Bilirubin Urine NEGATIVE  NEGATIVE   Ketones, ur 15 (*) NEGATIVE mg/dL   Protein, ur NEGATIVE  NEGATIVE mg/dL   Urobilinogen, UA 0.2  0.0 - 1.0 mg/dL   Nitrite NEGATIVE  NEGATIVE   Leukocytes, UA NEGATIVE  NEGATIVE   Comment: MICROSCOPIC NOT DONE ON URINES WITH NEGATIVE PROTEIN, BLOOD, LEUKOCYTES, NITRITE, OR GLUCOSE <1000 mg/dL.  MAGNESIUM     Status: None   Collection Time    07/31/13  8:10 PM      Result Value Ref Range   Magnesium 1.8  1.5 - 2.5 mg/dL  TSH     Status: None   Collection Time    07/31/13  8:10 PM      Result Value Ref Range   TSH 0.888  0.350 - 4.500 uIU/mL   Comment: Please note change in reference range.  PRO B NATRIURETIC PEPTIDE     Status: None   Collection Time    07/31/13  8:10 PM      Result Value Ref Range   Pro B Natriuretic peptide (BNP) 357.7  0 - 450 pg/mL    Dg Chest 2 View  07/31/2013   CLINICAL DATA:  Stroke.  EXAM: CHEST  2 VIEW  COMPARISON:  None.  FINDINGS: Mild hyperinflation. Lateral view degraded by patient arm position. Borderline loss of thoracic vertebral body height at multiple levels. Favored to be within normal variation.  Telemetry device projects over the left side of the mediastinum on the frontal.  Patient rotated right. Midline trachea. Normal heart size and mediastinal contours for age. No pleural effusion or pneumothorax. Biapical pleural  parenchymal scarring. Mild volume loss at the left lung base.  IMPRESSION: Hyperinflation and pleural parenchymal scarring.  No acute findings.   Electronically Signed   By: Abigail Miyamoto M.D.   On: 07/31/2013 15:23   Ct Head Wo Contrast  07/31/2013   CLINICAL DATA:  80 year old male code stroke. Left side facial droop. Initial encounter.  EXAM: CT HEAD WITHOUT CONTRAST  TECHNIQUE: Contiguous axial images were obtained from the base of the skull through the vertex without intravenous contrast.  COMPARISON:  None.  FINDINGS: No acute osseous abnormality identified. Visualized orbits and scalp soft tissues are within normal limits. Visualized paranasal sinuses and mastoids are clear.  Calcified atherosclerosis at the skull base. Cerebral volume is within  normal limits for age. No ventriculomegaly. No midline shift, mass effect, or evidence of intracranial mass lesion. No suspicious intracranial vascular hyperdensity. No evidence of cortically based acute infarction identified. Gray-white matter differentiation is within normal limits throughout the brain. No acute intracranial hemorrhage identified.  IMPRESSION: Normal for age non contrast CT appearance of the brain.  Study discussed by telephone with Dr. Roland Rack on 07/31/2013 at 11:04 .   Electronically Signed   By: Lars Pinks M.D.   On: 07/31/2013 11:04   Charles Jodene Nam Head Wo Contrast  07/31/2013   CLINICAL DATA:  80 year old male with slurred speech and facial droop. Difficulty walking. Code stroke.  EXAM: MRI HEAD WITHOUT CONTRAST  MRA HEAD WITHOUT CONTRAST  TECHNIQUE: Multiplanar, multiecho pulse sequences of the brain and surrounding structures were obtained without intravenous contrast. Angiographic images of the head were obtained using MRA technique without contrast.  COMPARISON:  Non contrast head CT 1048 hr the same day.  FINDINGS: MRI HEAD FINDINGS  Cerebral volume is within normal limits for age. No restricted diffusion to suggest acute  infarction. No midline shift, mass effect, evidence of mass lesion, ventriculomegaly, extra-axial collection or acute intracranial hemorrhage. Cervicomedullary junction and pituitary are within normal limits.  Decreased distal left vertebral artery flow void (series 11, image 14). Other Major intracranial vascular flow voids are preserved.  Small focus of chronic blood products in the right parietal lobe (series 9, image 121) without associated T2 or FLAIR hyperintensity. Elsewhere normal for age gray and white matter throughout the brain.  Negative visualized cervical spine. Normal bone marrow signal. Visualized orbit soft tissues are within normal limits. Minor paranasal sinus mucosal thickening. Mastoids are clear. Visible internal auditory structures appear normal. Visualized scalp soft tissues are within normal limits.  MRA HEAD FINDINGS  Absent antegrade flow in the distal left vertebral artery, instilled just before the vertebrobasilar junction. Preserved antegrade flow in the distal right vertebral artery. Normal right PICA origin.  The basilar artery is patent without stenosis. SCA and PCA origins are normal. Posterior communicating arteries are diminutive or absent. Bilateral PCA branches are within normal limits.  Antegrade flow in both ICA siphons. Tortuous distal cervical left ICA. No ICA stenosis. Normal ophthalmic artery origins. Unusual fenestrated appearance of the left ACA A1 segment. Normal carotid termini. Normal MCA and ACA origins otherwise. Anterior communicating artery remarkable for a 3 mm aneurysm directed inferiorly (series 5, images 89 and 90).  Other visualized ACA branches are within normal limits. Visualized bilateral MCA branches are within normal limits.  IMPRESSION: 1. No acute infarct. 2. 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 Favor chronic in light of no signal changes in the left cerebellum. 3. 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 4. Negative MRI appearance of the brain parenchyma aside from a small focus of chronic hemorrhage in the right parietal lobe. Study discussed by telephone with Dr. Roland Rack on 07/31/2013 at 15:05 .   Electronically Signed   By: Lars Pinks M.D.   On: 07/31/2013 15:05   Charles Brain Wo Contrast  07/31/2013   CLINICAL DATA:  80 year old male with slurred speech and facial droop. Difficulty walking. Code stroke.  EXAM: MRI HEAD WITHOUT CONTRAST  MRA HEAD WITHOUT CONTRAST  TECHNIQUE: Multiplanar, multiecho pulse sequences of the brain and surrounding structures were obtained without intravenous contrast. Angiographic images of the head were obtained using MRA technique without contrast.  COMPARISON:  Non  contrast head CT 1048 hr the same day.  FINDINGS: MRI HEAD FINDINGS  Cerebral volume is within normal limits for age. No restricted diffusion to suggest acute infarction. No midline shift, mass effect, evidence of mass lesion, ventriculomegaly, extra-axial collection or acute intracranial hemorrhage. Cervicomedullary junction and pituitary are within normal limits.  Decreased distal left vertebral artery flow void (series 11, image 14). Other Major intracranial vascular flow voids are preserved.  Small focus of chronic blood products in the right parietal lobe (series 9, image 121) without associated T2 or FLAIR hyperintensity. Elsewhere normal for age gray and white matter throughout the brain.  Negative visualized cervical spine. Normal bone marrow signal. Visualized orbit soft tissues are within normal limits. Minor paranasal sinus mucosal thickening. Mastoids are clear. Visible internal auditory structures appear normal. Visualized scalp soft tissues are within normal limits.  MRA HEAD FINDINGS  Absent antegrade flow in the distal left vertebral artery, instilled just before the vertebrobasilar junction. Preserved antegrade flow in the distal right vertebral artery. Normal  right PICA origin.  The basilar artery is patent without stenosis. SCA and PCA origins are normal. Posterior communicating arteries are diminutive or absent. Bilateral PCA branches are within normal limits.  Antegrade flow in both ICA siphons. Tortuous distal cervical left ICA. No ICA stenosis. Normal ophthalmic artery origins. Unusual fenestrated appearance of the left ACA A1 segment. Normal carotid termini. Normal MCA and ACA origins otherwise. Anterior communicating artery remarkable for a 3 mm aneurysm directed inferiorly (series 5, images 89 and 90).  Other visualized ACA branches are within normal limits. Visualized bilateral MCA branches are within normal limits.  IMPRESSION: 1. No acute infarct. 2. 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 Favor chronic in light of no signal changes in the left cerebellum. 3. 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 4. Negative MRI appearance of the brain parenchyma aside from a small focus of chronic hemorrhage in the right parietal lobe. Study discussed by telephone with Dr. Roland Rack on 07/31/2013 at 15:05 .   Electronically Signed   By: Lars Pinks M.D.   On: 07/31/2013 15:05   ECG- 1st degree AV block, IVCD sinus bradycardia  Review of Systems  All other systems reviewed and are negative.  Blood pressure 136/64, pulse 46, temperature 97.5 F (36.4 C), temperature source Oral, resp. rate 16, height '6\' 1"'  (1.854 m), weight 204 lb (92.534 kg), SpO2 96.00%. Physical Exam  Nursing note and vitals reviewed. Constitutional: He is oriented to person, place, and time. He appears well-developed and well-nourished. No distress.  Neck: No JVD present. No thyromegaly present.  Cardiovascular: Normal heart sounds and intact distal pulses.   Respiratory: Effort normal and breath sounds normal.  An implantable device is present over the left chest, subcutaneous  GI: Soft. Bowel sounds are  normal.  Musculoskeletal: He exhibits no edema.  Neurological: He is alert and oriented to person, place, and time.  Skin: He is not diaphoretic.    Assessment/Plan: Charles Peyton is a pleasant 80 year old male with a history of pre-syncope/syncopal events and previously asymptomatic bradycardia. He also has paroxysmal atrial fibrillation and is currently on amiodarone and coumadin. He does have a medtronic reveal monitor. Hopefully this is still active and may have recorded something during this event. It seems that at one point he was thought to have had sinus node dysfunction requiring a pacemaker. If we can find an arrhythmia, sinus arrest etc  that correlates with his event, he may need a pacemaker again. A note written by Dr Marcello Moores in 2013 notes that Charles Wolz had a cath in 2006 which showed mild non-obstructive disease.   Bradycardia -would stop amiodarone for now -telemetry -check TSH, potassium, magnesium, calcium  -would avoid AV nodal blocking agents  Syncope -TTE -telemetry -will try to interrogate his reveal monitor  AF -currently in sinus rhythm -coumadin    Javier Docker MD 07/31/2013, 9:14 PM

## 2013-07-31 NOTE — ED Notes (Signed)
PT grandaughter  Wants results results of CT of  head called to her.  # [phone] name Arma Heading

## 2013-07-31 NOTE — Consult Note (Signed)
Neurology Consultation Reason for Consult: Stroke Referring Physician: Joseph Art, C.  CC: Stroke  History is obtained from: Patient  HPI: Charles Berg is a 80 y.o. male with a history of atrial fibrillation on Coumadin who was in his normal state on awakening this morning. While making breakfast (waffles) he was speaking with his wife and she noticed that his speech become markedly slurred. She then called 911 out of concern for stroke. Initially 9:30 AM was page down, but this is when she noticed the abnormality she last saw him normal at 8:40 AM.  LKW: 8:40 a.m. tpa given?: no, mild symptoms, anticoagulated   ROS: A 14 point ROS was performed and is negative except as noted in the HPI.  Past medical history: Atrial fibrillation  Family History: Stroke  Social History: Tob: Previous smoker  Exam: Current vital signs: BP 156/71  Pulse 50  Temp(Src) 97.4 F (36.3 C) (Oral)  Resp 14  Ht 6\' 1"  (1.854 m)  Wt 92.534 kg (204 lb)  BMI 26.92 kg/m2  SpO2 99% Vital signs in last 24 hours: Temp:  [97.4 F (36.3 C)-97.7 F (36.5 C)] 97.4 F (36.3 C) (04/18 1557) Pulse Rate:  [41-60] 50 (04/18 1557) Resp:  [12-23] 14 (04/18 1330) BP: (119-156)/(53-100) 156/71 mmHg (04/18 1557) SpO2:  [99 %] 99 % (04/18 1041) Weight:  [92.534 kg (204 lb)] 92.534 kg (204 lb) (04/18 1041)  General: In bed, NAD  CV: Regular rate and rhythm Mental Status: Patient is awake, alert, oriented to person, place, month, year, and situation. Immediate and remote memory are intact. Patient is able to give a clear and coherent history. No signs of aphasia or neglect He is dysarthric Cranial Nerves: II: Visual Fields are full. Pupils are equal, round, and reactive to light.  Discs are difficult to visualize. III,IV, VI: EOMI without ptosis or diploplia.  V: Facial sensation is decreased on the left to pin VII: Facial movement is notable for left facial weakness VIII: hearing is intact to voice X:  Uvula elevates symmetrically XI: Shoulder shrug is symmetric. XII: tongue is midline without atrophy or fasciculations.  Motor: Tone is normal. Bulk is normal. 5/5 strength was present in all four extremities.  Sensory: Sensation is symmetric to light touch and temperature in the arms and legs. Deep Tendon Reflexes: 2+ and symmetric in the biceps and patellae.  Plantars: Toes are downgoing bilaterally.  Cerebellar: FNF and HKS are intact bilaterally Gait: Not assessed due to acute nature of evaluation and multiple medical monitors in ED setting.   I have reviewed labs in epic and the results pertinent to this consultation are:   I have reviewed the images obtained:CT head negative.   Impression: 80 year old male with sudden onset left facial droop left facial numbness and dysarthria. I am concerned for small infarct. Symptoms do seem to be slightly improving, and his INR is elevated and therefore I would not pursue TPA at this time. The symptoms are too mild to to pursue interventional therapy. He will need to be admitted to look for other possible causes of stroke in addition to his atrial fibrillation.   Recommendations: 1. HgbA1c, fasting lipid panel 2. MRI, MRA  of the brain without contrast 3. Frequent neuro checks 4. Echocardiogram 5. Carotid dopplers 6. Prophylactic therapy-coumadin 7. Risk factor modification 8. Telemetry monitoring 9. PT consult, OT consult, Speech consult    78, MD [facility] [phone]  If 7pm- 7am, please page neurology on call as listed in AMION.

## 2013-07-31 NOTE — Progress Notes (Signed)
Pt admitted from Ed from home with dx of

## 2013-07-31 NOTE — ED Provider Notes (Signed)
CSN: [id number]     Arrival date & time 07/31/13  1022 History   First MD Initiated Contact with Patient 07/31/13 1041     Chief Complaint  Patient presents with  . Code Stroke  . Bradycardia    An emergency department physician performed an initial assessment on this suspected stroke patient at 1030. (Consider location/radiation/quality/duration/timing/severity/associated sxs/prior Treatment) HPI Patient presents to the emergency department with acute onset of difficulty speaking abnormal gait while cooking breakfast this morning.  The patient, states, that he does not feel, weakness, in his extremities, but and had trouble walking and speaking.  Patient, states, that the chest pain, shortness of breath, nausea, vomiting, diarrhea, headache, blurred vision, dizziness, fever, abdominal pain, or syncope.  The patient states, that he has not had a stroke in the past.  The patient does have some confusion as well History reviewed. No pertinent past medical history. History reviewed. No pertinent past surgical history. History reviewed. No pertinent family history. History  Substance Use Topics  . Smoking status: Former Games developer  . Smokeless tobacco: Current User  . Alcohol Use: No    Review of Systems All other systems negative except as documented in the HPI. All pertinent positives and negatives as reviewed in the HPI.  Allergies  Review of patient's allergies indicates not on file.  Home Medications   Prior to Admission medications   Not on File   BP 152/60  Pulse 49  Temp(Src) 97.7 F (36.5 C) (Oral)  Resp 14  Ht 6\' 1"  (1.854 m)  Wt 204 lb (92.534 kg)  BMI 26.92 kg/m2  SpO2 99% Physical Exam  Nursing note and vitals reviewed. Constitutional: He is oriented to person, place, and time. He appears well-developed and well-nourished. No distress.  HENT:  Head: Normocephalic and atraumatic.  Mouth/Throat: Oropharynx is clear and moist.  Eyes: Pupils are equal, round, and  reactive to light.  Neck: Normal range of motion. Neck supple.  Cardiovascular: Normal rate, regular rhythm and normal heart sounds.  Exam reveals no gallop and no friction rub.   No murmur heard. Pulmonary/Chest: Effort normal and breath sounds normal.  Neurological: He is alert and oriented to person, place, and time. He exhibits normal muscle tone. Coordination normal.  Skin: Skin is warm and dry. No rash noted. No erythema.    ED Course  Procedures (including critical care time) Labs Review Labs Reviewed  PROTIME-INR - Abnormal; Notable for the following:    Prothrombin Time 27.8 (*)    INR 2.71 (*)    All other components within normal limits  APTT - Abnormal; Notable for the following:    aPTT 42 (*)    All other components within normal limits  CBC - Abnormal; Notable for the following:    RBC 4.05 (*)    Hemoglobin 12.2 (*)    HCT 36.9 (*)    Platelets 140 (*)    All other components within normal limits  COMPREHENSIVE METABOLIC PANEL - Abnormal; Notable for the following:    BUN 24 (*)    GFR calc non Af Amer 61 (*)    GFR calc Af Amer 71 (*)    All other components within normal limits  URINALYSIS, ROUTINE W REFLEX MICROSCOPIC - Abnormal; Notable for the following:    Leukocytes, UA TRACE (*)    All other components within normal limits  URINE MICROSCOPIC-ADD ON - Abnormal; Notable for the following:    Squamous Epithelial / LPF FEW (*)    All  other components within normal limits  I-STAT CHEM 8, ED - Abnormal; Notable for the following:    Hemoglobin 11.6 (*)    HCT 34.0 (*)    All other components within normal limits  DIFFERENTIAL  URINE RAPID DRUG SCREEN (HOSP PERFORMED)  CBG MONITORING, ED  I-STAT CHEM 8, ED  I-STAT TROPOININ, ED  I-STAT TROPOININ, ED    Imaging Review Ct Head Wo Contrast  07/31/2013   CLINICAL DATA:  80 year old male code stroke. Left side facial droop. Initial encounter.  EXAM: CT HEAD WITHOUT CONTRAST  TECHNIQUE: Contiguous axial  images were obtained from the base of the skull through the vertex without intravenous contrast.  COMPARISON:  None.  FINDINGS: No acute osseous abnormality identified. Visualized orbits and scalp soft tissues are within normal limits. Visualized paranasal sinuses and mastoids are clear.  Calcified atherosclerosis at the skull base. Cerebral volume is within normal limits for age. No ventriculomegaly. No midline shift, mass effect, or evidence of intracranial mass lesion. No suspicious intracranial vascular hyperdensity. No evidence of cortically based acute infarction identified. Gray-white matter differentiation is within normal limits throughout the brain. No acute intracranial hemorrhage identified.  IMPRESSION: Normal for age non contrast CT appearance of the brain.  Study discussed by telephone with Dr. Ritta Slot on 07/31/2013 at 11:04 .   Electronically Signed   By: Augusto Gamble M.D.   On: 07/31/2013 11:04     EKG Interpretation   Date/Time:  Saturday July 31 2013 10:38:23 EDT Ventricular Rate:  51 PR Interval:  243 QRS Duration: 128 QT Interval:  572 QTC Calculation: 527 R Axis:   105 Text Interpretation:  Sinus rhythm Prolonged PR interval Nonspecific  intraventricular conduction delay Lateral infarct, old Confirmed by  HARRISON  MD, FORREST ([id number]) on 07/31/2013 11:38:59 AM      I spoke with neurology, who will be down to see the patient as a code stroke.  The patient has declined TPA.  I spoke with the, [facility], who will admit the patient to the hospital for further evaluation and care.     Carlyle Dolly, PA-C 07/31/13 [phone]

## 2013-07-31 NOTE — ED Notes (Signed)
CBG 82 

## 2013-07-31 NOTE — ED Notes (Signed)
Notified [facility] to Activate CODE Stroke

## 2013-07-31 NOTE — Progress Notes (Signed)
ANTICOAGULATION CONSULT NOTE - Initial Consult  Pharmacy Consult for Warfarin Indication: Hx. Afib   Allergies  Allergen Reactions  . Aspirin Other (See Comments)    Yeast infection in mouth  . Sulfa Antibiotics Other (See Comments)    unknown  . Penicillins Rash   Patient Measurements: Height: 6\' 1"  (185.4 cm) Weight: 204 lb (92.534 kg) IBW/kg (Calculated) : 79.9  Vital Signs: Temp: 97.4 F (36.3 C) (04/18 1557) Temp src: Oral (04/18 1557) BP: 156/71 mmHg (04/18 1557) Pulse Rate: 50 (04/18 1557)  Labs:  Recent Labs  07/31/13 1055 07/31/13 1100  HGB 11.6* 12.2*  HCT 34.0* 36.9*  PLT  --  140*  APTT  --  42*  LABPROT  --  27.8*  INR  --  2.71*  CREATININE 1.20 1.11   Estimated Creatinine Clearance: 61 ml/min (by C-G formula based on Cr of 1.11).  Medications:  Prescriptions prior to admission  Medication Sig Dispense Refill  . amiodarone (PACERONE) 200 MG tablet Take 200 mg by mouth 2 (two) times daily.      08/02/13 amLODipine (NORVASC) 10 MG tablet Take 10 mg by mouth every morning.      Marland Kitchen atorvastatin (LIPITOR) 20 MG tablet Take 20 mg by mouth every evening.      . Calcium Citrate-Vitamin D (CITRACAL + D PO) Take 1 tablet by mouth 2 (two) times daily.      . celecoxib (CELEBREX) 50 MG capsule Take 50 mg by mouth 3 (three) times a week. Monday, Wednesday and Thursday      . cyanocobalamin 500 MCG tablet Take 500 mcg by mouth every evening.      . DONEPEZIL HCL PO Take 8 mg by mouth every evening.      . Dutasteride-Tamsulosin HCl (JALYN) 0.5-0.4 MG CAPS Take 1 tablet by mouth every evening.      . folic acid (FOLVITE) 1 MG tablet Take 1 mg by mouth every morning.      . gabapentin (NEURONTIN) 100 MG capsule Take 100-200 mg by mouth 2 (two) times daily. 1 capsule in the morning and two capsules in the evening      . HYDROCODONE-ACETAMINOPHEN PO Take 1 tablet by mouth every 6 (six) hours as needed (pain). 2/25      . Hydrocodone-Homatropine (HYCODAN PO) Take 5 mLs by  mouth every 6 (six) hours as needed (cough).      3/25 levothyroxine (SYNTHROID, LEVOTHROID) 175 MCG tablet Take 175 mcg by mouth daily before breakfast.      . loratadine (CLARITIN) 10 MG tablet Take 10 mg by mouth at bedtime.      Marland Kitchen losartan (COZAAR) 100 MG tablet Take 100 mg by mouth every morning.      . montelukast (SINGULAIR) 10 MG tablet Take 10 mg by mouth at bedtime.      . thiamine 100 MG tablet Take 100 mg by mouth every evening.      . traZODone (DESYREL) 50 MG tablet Take 200 mg by mouth at bedtime.      Marland Kitchen warfarin (COUMADIN) 1 MG tablet Take 1 mg by mouth every evening. Takes along with the 2.5mg  to equal 3.5mg       . warfarin (COUMADIN) 5 MG tablet Take 2.5 mg by mouth every evening. Takes along with a 1mg  tablet to equal 3.5mg       . Xylometazoline HCl (4-WAY NASAL SPRAY NA) Place 1 spray into both nostrils 4 (four) times daily as needed (congestion).      Marland Kitchen  zolpidem (AMBIEN) 5 MG tablet Take 5 mg by mouth at bedtime.       Assessment: 80 yo male admitted with stroke like symptoms.  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  He is on Amiodarone for his rhythm.  We have been asked to monitor his warfarin therapy while he is hospitalized.  Today his INR is 2.71 which is within desired goal range.  His H/H are stable and he has some mild thrombocytopenia.    HOME:  Total dose is 3.5mg  daily of Warfarin  Goal of Therapy:  INR 2-3 Monitor platelets by anticoagulation protocol: Yes   Plan:  - Will continue his home regimen of Warfarin 3.5mg  daily - Monitor for bleeding complications - Adjust dose based on INR - Daily PT/INR and CBC weekly  Nadara Mustard, PharmD., MS Clinical Pharmacist Pager:  [phone] Thank you for allowing pharmacy to be part of this patients care team. 07/31/2013,4:51 PM

## 2013-08-01 DIAGNOSIS — I6350 Cerebral infarction due to unspecified occlusion or stenosis of unspecified cerebral artery: Secondary | ICD-10-CM

## 2013-08-01 DIAGNOSIS — I440 Atrioventricular block, first degree: Secondary | ICD-10-CM

## 2013-08-01 DIAGNOSIS — I498 Other specified cardiac arrhythmias: Secondary | ICD-10-CM

## 2013-08-01 DIAGNOSIS — R55 Syncope and collapse: Secondary | ICD-10-CM

## 2013-08-01 DIAGNOSIS — I059 Rheumatic mitral valve disease, unspecified: Secondary | ICD-10-CM

## 2013-08-01 LAB — LIPID PANEL
CHOLESTEROL: 119 mg/dL (ref 0–200)
HDL: 64 mg/dL (ref >39)
LDL Cholesterol: 46 mg/dL (ref 0–99)
TRIGLYCERIDES: 44 mg/dL (ref <150)
Total CHOL/HDL Ratio: 1.9 RATIO
VLDL: 9 mg/dL (ref 0–40)

## 2013-08-01 LAB — HEMOGLOBIN A1C
HEMOGLOBIN A1C: 5 % (ref <5.7)
Mean Plasma Glucose: 97 mg/dL (ref <117)

## 2013-08-01 LAB — BASIC METABOLIC PANEL
BUN: 22 mg/dL (ref 6–23)
CHLORIDE: 107 meq/L (ref 96–112)
CO2: 25 mEq/L (ref 19–32)
CREATININE: 1 mg/dL (ref 0.50–1.35)
Calcium: 8.7 mg/dL (ref 8.4–10.5)
GFR calc Af Amer: 80 mL/min — ABNORMAL LOW (ref >90)
GFR calc non Af Amer: 69 mL/min — ABNORMAL LOW (ref >90)
Glucose, Bld: 100 mg/dL — ABNORMAL HIGH (ref 70–99)
Potassium: 4.3 mEq/L (ref 3.7–5.3)
Sodium: 142 mEq/L (ref 137–147)

## 2013-08-01 LAB — MAGNESIUM
MAGNESIUM: 1.9 mg/dL (ref 1.5–2.5)
Magnesium: 1.9 mg/dL (ref 1.5–2.5)

## 2013-08-01 LAB — PROTIME-INR
INR: 3.52 — AB (ref 0.00–1.49)
PROTHROMBIN TIME: 34 s — AB (ref 11.6–15.2)

## 2013-08-01 LAB — T4, FREE: Free T4: 1.64 ng/dL (ref 0.80–1.80)

## 2013-08-01 LAB — TROPONIN I: Troponin I: <0.3 ng/mL (ref <0.30)

## 2013-08-01 MED ORDER — CELECOXIB 100 MG PO CAPS
100.0000 mg | ORAL_CAPSULE | ORAL | Status: DC
  Administered 2013-08-02: 100 mg via ORAL
  Filled 2013-08-01: qty 1

## 2013-08-01 NOTE — Progress Notes (Signed)
Primary cardiologist: Dr. Maisie Fus ([facility])  Subjective:   Feels better today. No chest pain or palpitations.   Objective:   Temp:  [97.4 F (36.3 C)-98 F (36.7 C)] 97.8 F (36.6 C) (04/19 0656) Pulse Rate:  [41-60] 43 (04/19 0656) Resp:  [12-23] 12 (04/19 0656) BP: (119-156)/(52-100) 139/58 mmHg (04/19 0656) SpO2:  [92 %-99 %] 93 % (04/19 0656) Weight:  [204 lb (92.534 kg)] 204 lb (92.534 kg) (04/18 1041) Last BM Date: 07/30/13  Filed Weights   07/31/13 1041  Weight: 204 lb (92.534 kg)    Intake/Output Summary (Last 24 hours) at 08/01/13 0846 Last data filed at 08/01/13 0400  Gross per 24 hour  Intake    560 ml  Output    750 ml  Net   -190 ml    Telemetry: Sinus bradycardia as low as 40 BPM, no pauses.  Exam:  General: Appears comfortable.  Lungs: Clear, nonlabored.  Cardiac: RRR, no gallop.  Extremities: No edema.   Lab Results:  Basic Metabolic Panel:  Recent Labs Lab 07/31/13 1055 07/31/13 1100 07/31/13 2010 08/01/13 0635  NA 141 143  --   --   K 3.8 4.0  --   --   CL 105 106  --   --   CO2  --  24  --   --   GLUCOSE 81 83  --   --   BUN 22 24*  --   --   CREATININE 1.20 1.11  --   --   CALCIUM  --  9.0  --   --   MG  --   --  1.8 1.9    Liver Function Tests:  Recent Labs Lab 07/31/13 1100  AST 21  ALT 26  ALKPHOS 66  BILITOT 0.3  PROT 6.2  ALBUMIN 3.5    CBC:  Recent Labs Lab 07/31/13 1055 07/31/13 1100  WBC  --  4.2  HGB 11.6* 12.2*  HCT 34.0* 36.9*  MCV  --  91.1  PLT  --  140*    Cardiac Enzymes:  Recent Labs Lab 07/31/13 1712 07/31/13 2230 08/01/13 0635  TROPONINI <0.30 <0.30 <0.30    BNP:  Recent Labs  07/31/13 2010  PROBNP 357.7    Coagulation:  Recent Labs Lab 07/31/13 1100 08/01/13 0635  INR 2.71* 3.52*     Medications:   Scheduled Medications: . amLODipine  10 mg Oral Q0600  . atorvastatin  20 mg Oral QPM  . [START ON 08/02/2013] celecoxib  100 mg Oral Q M,W,F  . [START ON  08/02/2013] celecoxib  50 mg Oral Once per day on Mon Wed Fri  . cyanocobalamin  500 mcg Oral QPM  . donepezil  10 mg Oral QHS  . dutasteride  0.5 mg Oral QPM   And  . tamsulosin  0.4 mg Oral QPC supper  . folic acid  1 mg Oral Daily  . levothyroxine  175 mcg Oral QAC breakfast  . loratadine  10 mg Oral QHS  . losartan  100 mg Oral Daily  . montelukast  10 mg Oral QHS  . thiamine  100 mg Oral QPM  . traZODone  200 mg Oral QHS  . warfarin  3.5 mg Oral q1800  . Warfarin - Pharmacist Dosing Inpatient   Does not apply q1800  . zolpidem  5 mg Oral QHS    Infusions: . sodium chloride 100 mL/hr at 07/31/13 1719    PRN Medications: hydrALAZINE, oxyCODONE-acetaminophen, senna-docusate  Assessment:   1. Bradycardia, not clearly symptomatic or necessarily explanation for his presentation. Has been evaluated at [facility] in past (see consult note) and has Medtronic reveal monitor in place (2-3 years?). It may not be functioning any more.  2. History of PAF, on Coumadin. Currently in sinus rhythm. Amiodarone has been held for now.  3. 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 No chest pain and cardiac markers are negative.   Plan/Discussion:    Will ask EP to get Medtronic reveal monitor interrogated - it may not be functioning however. Follow telemetry. Off Amiodarone for now. No other rate lowering agents, although Aricept can also lead to bradycardia.   Jonelle Sidle, M.D., F.A.C.C.

## 2013-08-01 NOTE — Progress Notes (Addendum)
ANTICOAGULATION CONSULT NOTE - Follow Up  Pharmacy Consult for Warfarin Indication: Hx. Afib   Allergies  Allergen Reactions  . Aspirin Other (See Comments)    Yeast infection in mouth  . Sulfa Antibiotics Other (See Comments)    unknown  . Penicillins Rash   Patient Measurements: Height: 6\' 1"  (185.4 cm) Weight: 204 lb (92.534 kg) IBW/kg (Calculated) : 79.9  Vital Signs: Temp: 97.8 F (36.6 C) (04/19 0656) Temp src: Oral (04/19 0656) BP: 139/58 mmHg (04/19 0656) Pulse Rate: 43 (04/19 0656)  Labs:  Recent Labs  07/31/13 1055 07/31/13 1100 07/31/13 1712 07/31/13 2230 08/01/13 0635  HGB 11.6* 12.2*  --   --   --   HCT 34.0* 36.9*  --   --   --   PLT  --  140*  --   --   --   APTT  --  42*  --   --   --   LABPROT  --  27.8*  --   --  34.0*  INR  --  2.71*  --   --  3.52*  CREATININE 1.20 1.11  --   --   --   TROPONINI  --   --  <0.30 <0.30 <0.30   Estimated Creatinine Clearance: 61 ml/min (by C-G formula based on Cr of 1.11).  Medications:  Prescriptions prior to admission  Medication Sig Dispense Refill  . amiodarone (PACERONE) 200 MG tablet Take 200 mg by mouth 2 (two) times daily.      08/03/13 amLODipine (NORVASC) 10 MG tablet Take 10 mg by mouth every morning.      Marland Kitchen atorvastatin (LIPITOR) 20 MG tablet Take 20 mg by mouth every evening.      . Calcium Citrate-Vitamin D (CITRACAL + D PO) Take 1 tablet by mouth 2 (two) times daily.      . celecoxib (CELEBREX) 50 MG capsule Take 50 mg by mouth 3 (three) times a week. Monday, Wednesday and Thursday      . cyanocobalamin 500 MCG tablet Take 500 mcg by mouth every evening.      . DONEPEZIL HCL PO Take 8 mg by mouth every evening.      . Dutasteride-Tamsulosin HCl (JALYN) 0.5-0.4 MG CAPS Take 1 tablet by mouth every evening.      . folic acid (FOLVITE) 1 MG tablet Take 1 mg by mouth every morning.      . gabapentin (NEURONTIN) 100 MG capsule Take 100-200 mg by mouth 2 (two) times daily. 1 capsule in the morning and two  capsules in the evening      . HYDROCODONE-ACETAMINOPHEN PO Take 1 tablet by mouth every 6 (six) hours as needed (pain). 2/25      . Hydrocodone-Homatropine (HYCODAN PO) Take 5 mLs by mouth every 6 (six) hours as needed (cough).      3/25 levothyroxine (SYNTHROID, LEVOTHROID) 175 MCG tablet Take 175 mcg by mouth daily before breakfast.      . loratadine (CLARITIN) 10 MG tablet Take 10 mg by mouth at bedtime.      Marland Kitchen losartan (COZAAR) 100 MG tablet Take 100 mg by mouth every morning.      . montelukast (SINGULAIR) 10 MG tablet Take 10 mg by mouth at bedtime.      . thiamine 100 MG tablet Take 100 mg by mouth every evening.      . traZODone (DESYREL) 50 MG tablet Take 200 mg by mouth at bedtime.      Marland Kitchen warfarin (  COUMADIN) 1 MG tablet Take 1 mg by mouth every evening. Takes along with the 2.5mg  to equal 3.5mg       . warfarin (COUMADIN) 5 MG tablet Take 2.5 mg by mouth every evening. Takes along with a 1mg  tablet to equal 3.5mg       . Xylometazoline HCl (4-WAY NASAL SPRAY NA) Place 1 spray into both nostrils 4 (four) times daily as needed (congestion).      zolpidem (AMBIEN) 5 MG tablet Take 5 mg by mouth at bedtime.       Assessment: 80 yo male admitted with stroke like symptoms.  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  He is on Amiodarone for his rhythm.  We have been asked to monitor his warfarin therapy while he is hospitalized.  Today his INR increased significantly on home regimen and is 3.52.  Would have expected a decrease considering his Amiodarone is on hold.  His H/H are stable yesterday and he is without noted bleeding today.     HOME:  Total dose is 3.5mg  daily of Warfarin  Goal of Therapy:  INR 2-3 Monitor platelets by anticoagulation protocol: Yes   Plan:  - Will hold his Warfarin therapy today - Monitor for bleeding complications - Adjust dose based on INR - Daily PT/INR and CBC weekly  76, PharmD., MS Clinical Pharmacist Pager:  [phone] Thank you for allowing pharmacy to be part of this patients care team. 08/01/2013,10:39 AM

## 2013-08-01 NOTE — Evaluation (Signed)
Physical Therapy Evaluation Patient Details Name: Charles Berg MRN: [id number] DOB: 08/09/1931 Today's Date: 08/01/2013   History of Present Illness  80 y.o. male admitted with dx of stroke and bradycardia.  Clinical Impression  Pt admitted with stroke and bradycardia. Pt is at baseline mobility level.  No PT intervention indicated.  PT signing off.      Follow Up Recommendations No PT follow up    Equipment Recommendations  None recommended by PT    Recommendations for Other Services       Precautions / Restrictions Precautions Precautions: None      Mobility  Bed Mobility Overal bed mobility: Modified Independent                Transfers Overall transfer level: Modified independent Equipment used: None                Ambulation/Gait Ambulation/Gait assistance: Supervision Ambulation Distance (Feet): 250 Feet Assistive device: None Gait Pattern/deviations: WFL(Within Functional Limits) Gait velocity: decreased   General Gait Details: mildly unsteady but no LOB  Stairs            Wheelchair Mobility    Modified Rankin (Stroke Patients Only) Modified Rankin (Stroke Patients Only) Pre-Morbid Rankin Score: No significant disability Modified Rankin: No significant disability     Balance Overall balance assessment: Modified Independent                                           Pertinent Vitals/Pain 0/10    Home Living Family/patient expects to be discharged to:: Private residence Living Arrangements: Spouse/significant other Available Help at Discharge: Family;Available 24 hours/day Type of Home: House Home Access: Stairs to enter Entrance Stairs-Rails: None Entrance Stairs-Number of Steps: 2 Home Layout: One level Home Equipment: Cane - single point      Prior Function Level of Independence: Independent               Hand Dominance        Extremity/Trunk Assessment   Upper Extremity  Assessment: Overall WFL for tasks assessed           Lower Extremity Assessment: Overall WFL for tasks assessed         Communication   Communication: No difficulties  Cognition Arousal/Alertness: Awake/alert   Overall Cognitive Status: Within Functional Limits for tasks assessed                      General Comments      Exercises        Assessment/Plan    PT Assessment Patent does not need any further PT services  PT Diagnosis     PT Problem List    PT Treatment Interventions     PT Goals (Current goals can be found in the Care Plan section) Acute Rehab PT Goals Patient Stated Goal: home PT Goal Formulation: No goals set, d/c therapy    Frequency     Barriers to discharge        Co-evaluation               End of Session Equipment Utilized During Treatment: Gait belt Activity Tolerance: Patient tolerated treatment well Patient left: in bed;with family/visitor present;with call bell/phone within reach Nurse Communication: Mobility status         Time: 1324-4010 PT Time Calculation (min): 23 min   Charges:  PT Evaluation $Initial PT Evaluation Tier I: 1 Procedure PT Treatments $Gait Training: 8-22 mins   PT G Codes:          Danelle Berry 08/01/2013, 12:32 PM  Aida Raider, PT  Office # [phone] Pager [phone]

## 2013-08-01 NOTE — Progress Notes (Signed)
Bilateral carotid artery duplex:  1-39% ICA stenosis.  Vertebral artery flow is antegrade.     

## 2013-08-01 NOTE — Progress Notes (Signed)
[facility] PROGRESS NOTE  Charles Berg NTI:[id number] DOB: Aug 31, 1931 DOA: 07/31/2013 PCP: Daniel Nones  Assessment/Plan:  Sick sinus syndrome?  -Continue to hold amiodarone per cardiology. Spoke with Dr. Anne Ng (cardiology) on 4/18  - 4/19 he is scheduled for echocardiogram and carotid Doppler study today.  Paroxysmal atrial fibrillation  -Patient currently in sinus bradycardia.   -Continue Coumadin per pharmacy   HTN  -Continue amlodipine 10 mg  -Continue losartan 100 mg  -Hydralazine IV when necessary SBP>160 or DBP> 100   Hypothyroidism  -TSH; within normal limits  -continue Synthroid 175 mcg daily -free T4; pending   Coronary artery disease  -Obtain troponin x3 (negative) -Obtain proBNP mildly elevated at 357  -Echocardiogram ordered   HLD  -lipid panel; within AHA guideline   Rheumatoid arthritis  -Continue patient's Percocet 5/325 mg Q6 hr PRN pain  -Will hold gabapentin as patient and wife unsure why she is on this medication   Insomnia  -Continue trazodone 200 mg QHS   Aneurysm  -Patient has newly diagnosed aneurysm 3 mm; considered low risk for rupture will need reimaging in 6 months.      Code Status: Full  Family Communication: Family at bedside  Disposition Plan: Completion of cardiology work up    Procedure  MRI brain without contrast 07/31/2013  -No acute infarct.  - Likely Slow flow/occlusion of distal left vertebral artery.  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  Per care everywhere Dr. Gerre Pebbles (electrophysiology [facility])  October 2006 cardiac catheterization 40% LAD, 40% RCA, and 25% lesions in other vessels.    Antibiotics     Consultation Dr Franky Macho Kohan(Cardiology)      HPI/Subjective: Charles Berg is a 80 y.o. male WM PMHx symptomatic bradycardia with pacer placement 2012/remove 2 weeks later secondary to infection, S/P  subcutaneous loop recorder. States since 2012 has been on rate control medication amiodarone 200 mg BID, without side effect. On further questioning the daughter and wife admit that on numerous occasions patient has had presyncope and had to sit down, and has had one recent occurrence of syncope. Review of care everywhere; shows patient was last seen by Dr. Gerre Pebbles electrophysiology [facility] January 2010 at which time he was believed to be suffering from sick sinus syndrome. At that time patient's Cardizem and Toprol were discontinued and patient was continued on amiodarone for rhythm control. Was felt at that time patient may need a pacemaker instead of atrial fibrillation ablation.  Presented to the emergency department with acute onset of difficulty speaking abnormal gait while cooking breakfast this morning. The patient, states, that he does not feel, weakness, in his extremities, but and had trouble walking and speaking. Patient, states, that the chest pain, shortness of breath, nausea, vomiting, diarrhea, headache, blurred vision, dizziness, fever, abdominal pain, or syncope. The patient states, that he has not had a stroke in the past. The patient does have some confusion as well. Patient wife provided a list showing that patient S/P cardioversion 08/05/2007 and 01/30/2008, S/P CardioNet monitor January 2010 and 06/08/2010  4/19 patient's HR overnight high 30s- low 40s asymptomatic (note patient has not been out of bed), negative dizziness/ CP/ SOB.   Objective: Filed Vitals:   07/31/13 2352 08/01/13 0146 08/01/13 0402 08/01/13 0656  BP: 146/57 143/59 131/52 139/58  Pulse: 41 44 46 43  Temp: 98 F (36.7 C) 98 F (36.7 C) 97.7 F (36.5 C)  97.8 F (36.6 C)  TempSrc: Oral Oral Oral Oral  Resp: 14 16 16 12   Height:      Weight:      SpO2: 93% 92% 92% 93%    Intake/Output Summary (Last 24 hours) at 08/01/13 0851 Last data filed at 08/01/13 0400  Gross per 24 hour   Intake    560 ml  Output    750 ml  Net   -190 ml   Filed Weights   07/31/13 1041  Weight: 92.534 kg (204 lb)    Exam:   General:  A./O. x4, NAD  Cardiovascular: Bradycardic, regular rhythm, negative murmurs rubs or gallops  Respiratory: Clear to auscultation bilateral  Abdomen: Soft, nontender, nondistended, plus bowel sound  Musculoskeletal: Negative pedal edema   Data Reviewed: Basic Metabolic Panel:  Recent Labs Lab 07/31/13 1055 07/31/13 1100 07/31/13 2010 08/01/13 0635  NA 141 143  --   --   K 3.8 4.0  --   --   CL 105 106  --   --   CO2  --  24  --   --   GLUCOSE 81 83  --   --   BUN 22 24*  --   --   CREATININE 1.20 1.11  --   --   CALCIUM  --  9.0  --   --   MG  --   --  1.8 1.9   Liver Function Tests:  Recent Labs Lab 07/31/13 1100  AST 21  ALT 26  ALKPHOS 66  BILITOT 0.3  PROT 6.2  ALBUMIN 3.5   No results found for this basename: LIPASE, AMYLASE,  in the last 168 hours No results found for this basename: AMMONIA,  in the last 168 hours CBC:  Recent Labs Lab 07/31/13 1055 07/31/13 1100  WBC  --  4.2  NEUTROABS  --  3.0  HGB 11.6* 12.2*  HCT 34.0* 36.9*  MCV  --  91.1  PLT  --  140*   Cardiac Enzymes:  Recent Labs Lab 07/31/13 1712 07/31/13 2230 08/01/13 0635  TROPONINI <0.30 <0.30 <0.30   BNP (last 3 results)  Recent Labs  07/31/13 2010  PROBNP 357.7   CBG:  Recent Labs Lab 07/31/13 1100 07/31/13 1823 07/31/13 2150  GLUCAP 82 96 103*    No results found for this or any previous visit (from the past 240 hour(s)).   Studies: Dg Chest 2 View  07/31/2013   CLINICAL DATA:  Stroke.  EXAM: CHEST  2 VIEW  COMPARISON:  None.  FINDINGS: Mild hyperinflation. Lateral view degraded by patient arm position. Borderline loss of thoracic vertebral body height at multiple levels. Favored to be within normal variation.  Telemetry device projects over the left side of the mediastinum on the frontal.  Patient rotated right.  Midline trachea. Normal heart size and mediastinal contours for age. No pleural effusion or pneumothorax. Biapical pleural parenchymal scarring. Mild volume loss at the left lung base.  IMPRESSION: Hyperinflation and pleural parenchymal scarring.  No acute findings.   Electronically Signed   By: 08/02/2013 M.D.   On: 07/31/2013 15:23   Ct Head Wo Contrast  07/31/2013   CLINICAL DATA:  80 year old male code stroke. Left side facial droop. Initial encounter.  EXAM: CT HEAD WITHOUT CONTRAST  TECHNIQUE: Contiguous axial images were obtained from the base of the skull through the vertex without intravenous contrast.  COMPARISON:  None.  FINDINGS: No acute osseous abnormality identified. Visualized orbits and scalp  soft tissues are within normal limits. Visualized paranasal sinuses and mastoids are clear.  Calcified atherosclerosis at the skull base. Cerebral volume is within normal limits for age. No ventriculomegaly. No midline shift, mass effect, or evidence of intracranial mass lesion. No suspicious intracranial vascular hyperdensity. No evidence of cortically based acute infarction identified. Gray-white matter differentiation is within normal limits throughout the brain. No acute intracranial hemorrhage identified.  IMPRESSION: Normal for age non contrast CT appearance of the brain.  Study discussed by telephone with Dr. Ritta SlotMCNEILL KIRKPATRICK on 07/31/2013 at 11:04 .   Electronically Signed   By: Augusto GambleLee  Hall M.D.   On: 07/31/2013 11:04   Mr Maxine GlennMra Head Wo Contrast  07/31/2013   CLINICAL DATA:  80 year old male with slurred speech and facial droop. Difficulty walking. Code stroke.  EXAM: MRI HEAD WITHOUT CONTRAST  MRA HEAD WITHOUT CONTRAST  TECHNIQUE: Multiplanar, multiecho pulse sequences of the brain and surrounding structures were obtained without intravenous contrast. Angiographic images of the head were obtained using MRA technique without contrast.  COMPARISON:  Non contrast head CT 1048 hr the same day.   FINDINGS: MRI HEAD FINDINGS  Cerebral volume is within normal limits for age. No restricted diffusion to suggest acute infarction. No midline shift, mass effect, evidence of mass lesion, ventriculomegaly, extra-axial collection or acute intracranial hemorrhage. Cervicomedullary junction and pituitary are within normal limits.  Decreased distal left vertebral artery flow void (series 11, image 14). Other Major intracranial vascular flow voids are preserved.  Small focus of chronic blood products in the right parietal lobe (series 9, image 121) without associated T2 or FLAIR hyperintensity. Elsewhere normal for age gray and white matter throughout the brain.  Negative visualized cervical spine. Normal bone marrow signal. Visualized orbit soft tissues are within normal limits. Minor paranasal sinus mucosal thickening. Mastoids are clear. Visible internal auditory structures appear normal. Visualized scalp soft tissues are within normal limits.  MRA HEAD FINDINGS  Absent antegrade flow in the distal left vertebral artery, instilled just before the vertebrobasilar junction. Preserved antegrade flow in the distal right vertebral artery. Normal right PICA origin.  The basilar artery is patent without stenosis. SCA and PCA origins are normal. Posterior communicating arteries are diminutive or absent. Bilateral PCA branches are within normal limits.  Antegrade flow in both ICA siphons. Tortuous distal cervical left ICA. No ICA stenosis. Normal ophthalmic artery origins. Unusual fenestrated appearance of the left ACA A1 segment. Normal carotid termini. Normal MCA and ACA origins otherwise. Anterior communicating artery remarkable for a 3 mm aneurysm directed inferiorly (series 5, images 89 and 90).  Other visualized ACA branches are within normal limits. Visualized bilateral MCA branches are within normal limits.  IMPRESSION: 1. No acute infarct. 2. 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 Favor chronic in  light of no signal changes in the left cerebellum. 3. 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 4. Negative MRI appearance of the brain parenchyma aside from a small focus of chronic hemorrhage in the right parietal lobe. Study discussed by telephone with Dr. Ritta SlotMcNeill Kirkpatrick on 07/31/2013 at 15:05 .   Electronically Signed   By: Augusto GambleLee  Hall M.D.   On: 07/31/2013 15:05   Mr Brain Wo Contrast  07/31/2013   CLINICAL DATA:  80 year old male with slurred speech and facial droop. Difficulty walking. Code stroke.  EXAM: MRI HEAD WITHOUT CONTRAST  MRA HEAD WITHOUT CONTRAST  TECHNIQUE: Multiplanar, multiecho pulse sequences of the brain  and surrounding structures were obtained without intravenous contrast. Angiographic images of the head were obtained using MRA technique without contrast.  COMPARISON:  Non contrast head CT 1048 hr the same day.  FINDINGS: MRI HEAD FINDINGS  Cerebral volume is within normal limits for age. No restricted diffusion to suggest acute infarction. No midline shift, mass effect, evidence of mass lesion, ventriculomegaly, extra-axial collection or acute intracranial hemorrhage. Cervicomedullary junction and pituitary are within normal limits.  Decreased distal left vertebral artery flow void (series 11, image 14). Other Major intracranial vascular flow voids are preserved.  Small focus of chronic blood products in the right parietal lobe (series 9, image 121) without associated T2 or FLAIR hyperintensity. Elsewhere normal for age gray and white matter throughout the brain.  Negative visualized cervical spine. Normal bone marrow signal. Visualized orbit soft tissues are within normal limits. Minor paranasal sinus mucosal thickening. Mastoids are clear. Visible internal auditory structures appear normal. Visualized scalp soft tissues are within normal limits.  MRA HEAD FINDINGS  Absent antegrade flow in the distal left vertebral artery,  instilled just before the vertebrobasilar junction. Preserved antegrade flow in the distal right vertebral artery. Normal right PICA origin.  The basilar artery is patent without stenosis. SCA and PCA origins are normal. Posterior communicating arteries are diminutive or absent. Bilateral PCA branches are within normal limits.  Antegrade flow in both ICA siphons. Tortuous distal cervical left ICA. No ICA stenosis. Normal ophthalmic artery origins. Unusual fenestrated appearance of the left ACA A1 segment. Normal carotid termini. Normal MCA and ACA origins otherwise. Anterior communicating artery remarkable for a 3 mm aneurysm directed inferiorly (series 5, images 89 and 90).  Other visualized ACA branches are within normal limits. Visualized bilateral MCA branches are within normal limits.  IMPRESSION: 1. No acute infarct. 2. 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 Favor chronic in light of no signal changes in the left cerebellum. 3. 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 4. Negative MRI appearance of the brain parenchyma aside from a small focus of chronic hemorrhage in the right parietal lobe. Study discussed by telephone with Dr. Ritta Slot on 07/31/2013 at 15:05 .   Electronically Signed   By: Augusto Gamble M.D.   On: 07/31/2013 15:05    Scheduled Meds: . amLODipine  10 mg Oral Q0600  . atorvastatin  20 mg Oral QPM  . [START ON 08/02/2013] celecoxib  100 mg Oral Q M,W,F  . [START ON 08/02/2013] celecoxib  50 mg Oral Once per day on Mon Wed Fri  . cyanocobalamin  500 mcg Oral QPM  . donepezil  10 mg Oral QHS  . dutasteride  0.5 mg Oral QPM   And  . tamsulosin  0.4 mg Oral QPC supper  . folic acid  1 mg Oral Daily  . levothyroxine  175 mcg Oral QAC breakfast  . loratadine  10 mg Oral QHS  . losartan  100 mg Oral Daily  . montelukast  10 mg Oral QHS  . thiamine  100 mg Oral QPM  . traZODone  200 mg Oral QHS  .  warfarin  3.5 mg Oral q1800  . Warfarin - Pharmacist Dosing Inpatient   Does not apply q1800  . zolpidem  5 mg Oral QHS   Continuous Infusions: . sodium chloride 100 mL/hr at 07/31/13 1719    Active Problems:   Stroke   HTN (hypertension)   HLD (hyperlipidemia)  Rheumatoid arthritis   Unspecified hypothyroidism   Hiatal hernia   Personal history of colonic polyps   CAD (coronary artery disease) of artery bypass graft   Paroxysmal atrial fibrillation   Insomnia   Sick sinus syndrome   Aneurysm of unspecified site    Time spent: 40 min   Drema Dallas  [facility] Pager [phone]. If 7PM-7AM, please contact night-coverage at [email or web address], password [facility] 08/01/2013, 8:51 AM  LOS: 1 day

## 2013-08-01 NOTE — Progress Notes (Signed)
Patient ID: Charles Berg, male   DOB: 05/22/1931, 80 y.o.   MRN: [id number] EP Attending  Asked by Dr. Diona Browner to interogate ILR. His device was interogated by me. His device is working normally and battery status "good". He has had no recent episodes of atrial fibrillation or significant bradycardia. He had an episode of atrial fib for several minutes (20) occuring several months ago.  He has not had his Loop recorder interogated for over 2 years!  Leonia Reeves.D.

## 2013-08-01 NOTE — Progress Notes (Signed)
Stroke Team Progress Note  HISTORY Docia BarrierWilliam H Berg is a 80 y.o. male with a history of atrial fibrillation on Coumadin who was in his normal state on awakening the morning of 07/31/2013. While making breakfast (waffles) he was speaking with his wife and she noticed that his speech become markedly slurred. She then called 911 out of concern for stroke. Initially 9:30 AM was page down, but this is when she noticed the abnormality she last saw him normal at 8:40 AM.   LKW: 8:40 a.m.  tpa given?: no, mild symptoms, anticoagulated    SUBJECTIVE The patient's wife is at the bedside. The patient feels back to baseline. Dr. Pearlean BrownieSethi discussed the possibility of changing to one of the newer anticoagulants instead of Coumadin. The patient and his wife are in favor of giving this a try.  OBJECTIVE Most recent Vital Signs: Filed Vitals:   07/31/13 2352 08/01/13 0146 08/01/13 0402 08/01/13 0656  BP: 146/57 143/59 131/52 139/58  Pulse: 41 44 46 43  Temp: 98 F (36.7 C) 98 F (36.7 C) 97.7 F (36.5 C) 97.8 F (36.6 C)  TempSrc: Oral Oral Oral Oral  Resp: 14 16 16 12   Height:      Weight:      SpO2: 93% 92% 92% 93%   CBG (last 3)   Recent Labs  07/31/13 1100 07/31/13 1823 07/31/13 2150  GLUCAP 82 96 103*    IV Fluid Intake:   . sodium chloride 100 mL/hr at 07/31/13 1719    MEDICATIONS  . amLODipine  10 mg Oral Q0600  . atorvastatin  20 mg Oral QPM  . [START ON 08/02/2013] celecoxib  100 mg Oral Q M,W,F  . [START ON 08/02/2013] celecoxib  50 mg Oral Once per day on Mon Wed Fri  . cyanocobalamin  500 mcg Oral QPM  . donepezil  10 mg Oral QHS  . dutasteride  0.5 mg Oral QPM   And  . tamsulosin  0.4 mg Oral QPC supper  . folic acid  1 mg Oral Daily  . levothyroxine  175 mcg Oral QAC breakfast  . loratadine  10 mg Oral QHS  . losartan  100 mg Oral Daily  . montelukast  10 mg Oral QHS  . thiamine  100 mg Oral QPM  . traZODone  200 mg Oral QHS  . warfarin  3.5 mg Oral q1800  .  Warfarin - Pharmacist Dosing Inpatient   Does not apply q1800  . zolpidem  5 mg Oral QHS   PRN:  hydrALAZINE, oxyCODONE-acetaminophen, senna-docusate  Diet:  Cardiac thin liquids Activity:  Bedrest DVT Prophylaxis:  Warfarin  CLINICALLY SIGNIFICANT STUDIES Basic Metabolic Panel:  Recent Labs Lab 07/31/13 1055 07/31/13 1100 07/31/13 2010 08/01/13 0635  NA 141 143  --   --   K 3.8 4.0  --   --   CL 105 106  --   --   CO2  --  24  --   --   GLUCOSE 81 83  --   --   BUN 22 24*  --   --   CREATININE 1.20 1.11  --   --   CALCIUM  --  9.0  --   --   MG  --   --  1.8 1.9   Liver Function Tests:  Recent Labs Lab 07/31/13 1100  AST 21  ALT 26  ALKPHOS 66  BILITOT 0.3  PROT 6.2  ALBUMIN 3.5   CBC:  Recent Labs Lab 07/31/13  1055 07/31/13 1100  WBC  --  4.2  NEUTROABS  --  3.0  HGB 11.6* 12.2*  HCT 34.0* 36.9*  MCV  --  91.1  PLT  --  140*   Coagulation:  Recent Labs Lab 07/31/13 1100 08/01/13 0635  LABPROT 27.8* 34.0*  INR 2.71* 3.52*   Cardiac Enzymes:  Recent Labs Lab 07/31/13 1712 07/31/13 2230 08/01/13 0635  TROPONINI <0.30 <0.30 <0.30   Urinalysis:  Recent Labs Lab 07/31/13 1229 07/31/13 1911  COLORURINE YELLOW YELLOW  LABSPEC 1.013 1.015  PHURINE 6.5 6.5  GLUCOSEU NEGATIVE NEGATIVE  HGBUR NEGATIVE NEGATIVE  BILIRUBINUR NEGATIVE NEGATIVE  KETONESUR NEGATIVE 15*  PROTEINUR NEGATIVE NEGATIVE  UROBILINOGEN 0.2 0.2  NITRITE NEGATIVE NEGATIVE  LEUKOCYTESUR TRACE* NEGATIVE   Lipid Panel    Component Value Date/Time   CHOL 119 07/31/2013 2010   TRIG 44 07/31/2013 2010   HDL 64 07/31/2013 2010   CHOLHDL 1.9 07/31/2013 2010   VLDL 9 07/31/2013 2010   LDLCALC 46 07/31/2013 2010   HgbA1C  No results found for this basename: HGBA1C    Urine Drug Screen:     Component Value Date/Time   LABOPIA NONE DETECTED 07/31/2013 1229   COCAINSCRNUR NONE DETECTED 07/31/2013 1229   LABBENZ NONE DETECTED 07/31/2013 1229   AMPHETMU NONE DETECTED 07/31/2013 1229    THCU NONE DETECTED 07/31/2013 1229   LABBARB NONE DETECTED 07/31/2013 1229    Alcohol Level: No results found for this basename: ETH,  in the last 168 hours  Dg Chest 2 View 07/31/2013    Hyperinflation and pleural parenchymal scarring.  No acute findings.    Ct Head Wo Contrast 07/31/2013    Normal for age non contrast CT appearance of the brain.     Mr Charles Berg Head Wo Contrast 07/31/2013    1. No acute infarct. 2. 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 Favor chronic in light of no signal changes in the left cerebellum. 3. 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 4. Negative MRI appearance of the brain parenchyma aside from a small focus of chronic hemorrhage in the right parietal lobe.    2D Echocardiogram  pending  Carotid Doppler  pending    EKG  sinus bradycardia rate 51 beats per minute. For complete results please see formal report.   Therapy Recommendations pending  Physical Exam    Mental Status:  Patient is awake, alert, oriented to person, place, month, year, and situation.  Immediate and remote memory are intact.  Patient is able to give a clear and coherent history.  No signs of aphasia or neglect  He is dysarthric  Cranial Nerves:  II: Visual Fields are full. Pupils are equal, round, and reactive to light. Discs are difficult to visualize.  III,IV, VI: EOMI without ptosis or diploplia.  V: Facial sensation is decreased on the left to pin  VII: Facial movement is notable for left facial weakness  VIII: hearing is intact to voice  X: Uvula elevates symmetrically  XI: Shoulder shrug is symmetric.  XII: tongue is midline without atrophy or fasciculations.  Motor:  Tone is normal. Bulk is normal. 5/5 strength was present in all four extremities.  Sensory:  Sensation is symmetric to light touch and temperature in the arms and legs.  Deep Tendon Reflexes:  2+ and symmetric in the biceps  and patellae.  Plantars:  Toes are downgoing bilaterally.  Cerebellar:  FNF and HKS are  intact bilaterally  Gait: Not assessed    ASSESSMENT [name] is a 80 y.o. male presenting with dysarthria. TPA was not administered since the patient was on warfarin for atrial fibrillation. INR at time of admission 2.71. CT and MRI showed no acute infarct. On warfarin prior to admission. Now on warfarin for secondary stroke prevention. Patient with resultant dysarthria. Stroke work up underway.   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  3 mm anterior communicating artery aneurysm   Small focus of chronic hemorrhage in the right parietal lobe.  History of paroxysmal atrial fibrillation  Chronic warfarin therapy - INR 3.52 today. ( Patient also on Celebrex )   SSS - may need pacemaker - followed at [facility]  Hypertension history  Hyperlipidemia history - has been on Lipitor - cholesterol 119; LDL 46  Question early dementia - on Aricept  Bradycardia - heart rate in the 40s    Hospital day # 1  TREATMENT/PLAN  Continue warfarin for now. Consider changing to Xarelto if no plans for pacemaker in the near future.  Await 2-D echo, hemoglobin A1c, and carotid Dopplers.  Await therapy evaluations  Probable TIA  Followup Dr. Pearlean Brownie in 2 months.    Delton See PA-C [facility] Pager [phone] 08/01/2013, 9:16 AM  I have personally obtained a history, examined the patient, evaluated imaging results, and formulated the assessment and plan of care. I agree with the above.  Delia Heady, MD  To contact Stroke Continuity provider, please refer to [email or web address]. After hours, contact General Neurology

## 2013-08-01 NOTE — Progress Notes (Signed)
Echo Lab  2D Echocardiogram completed.  Charles Berg, RDCS 08/01/2013 11:44 AM

## 2013-08-01 NOTE — Progress Notes (Signed)
Utilization review completed.  

## 2013-08-02 DIAGNOSIS — I498 Other specified cardiac arrhythmias: Secondary | ICD-10-CM

## 2013-08-02 DIAGNOSIS — I440 Atrioventricular block, first degree: Secondary | ICD-10-CM

## 2013-08-02 LAB — PROTIME-INR
INR: 3.44 — ABNORMAL HIGH (ref 0.00–1.49)
Prothrombin Time: 33.4 seconds — ABNORMAL HIGH (ref 11.6–15.2)

## 2013-08-02 LAB — MAGNESIUM: Magnesium: 1.9 mg/dL (ref 1.5–2.5)

## 2013-08-02 NOTE — Progress Notes (Signed)
ANTICOAGULATION CONSULT NOTE - Follow Up  Pharmacy Consult for Warfarin Indication: Hx. Afib   Allergies  Allergen Reactions  . Aspirin Other (See Comments)    Yeast infection in mouth  . Sulfa Antibiotics Other (See Comments)    unknown  . Penicillins Rash   Patient Measurements: Height: 6\' 1"  (185.4 cm) Weight: 204 lb (92.534 kg) IBW/kg (Calculated) : 79.9  Vital Signs: Temp: 97.8 F (36.6 C) (04/20 0603) Temp src: Oral (04/20 0603) BP: 142/58 mmHg (04/20 0603) Pulse Rate: 49 (04/20 0603)  Labs:  Recent Labs  07/31/13 1055 07/31/13 1100 07/31/13 1712 07/31/13 2230 08/01/13 0635 08/01/13 1028 08/02/13 0425  HGB 11.6* 12.2*  --   --   --   --   --   HCT 34.0* 36.9*  --   --   --   --   --   PLT  --  140*  --   --   --   --   --   APTT  --  42*  --   --   --   --   --   LABPROT  --  27.8*  --   --  34.0*  --  33.4*  INR  --  2.71*  --   --  3.52*  --  3.44*  CREATININE 1.20 1.11  --   --   --  1.00  --   TROPONINI  --   --  <0.30 <0.30 <0.30  --   --    Estimated Creatinine Clearance: 67.7 ml/min (by C-G formula based on Cr of 1).  Medications:  Prescriptions prior to admission  Medication Sig Dispense Refill  . amiodarone (PACERONE) 200 MG tablet Take 200 mg by mouth 2 (two) times daily.      08/04/13 amLODipine (NORVASC) 10 MG tablet Take 10 mg by mouth every morning.      Marland Kitchen atorvastatin (LIPITOR) 20 MG tablet Take 20 mg by mouth every evening.      . Calcium Citrate-Vitamin D (CITRACAL + D PO) Take 1 tablet by mouth 2 (two) times daily.      . celecoxib (CELEBREX) 50 MG capsule Take 50 mg by mouth 3 (three) times a week. Monday, Wednesday and Thursday      . cyanocobalamin 500 MCG tablet Take 500 mcg by mouth every evening.      . DONEPEZIL HCL PO Take 8 mg by mouth every evening.      . Dutasteride-Tamsulosin HCl (JALYN) 0.5-0.4 MG CAPS Take 1 tablet by mouth every evening.      . folic acid (FOLVITE) 1 MG tablet Take 1 mg by mouth every morning.      .  gabapentin (NEURONTIN) 100 MG capsule Take 100-200 mg by mouth 2 (two) times daily. 1 capsule in the morning and two capsules in the evening      . HYDROCODONE-ACETAMINOPHEN PO Take 1 tablet by mouth every 6 (six) hours as needed (pain). 2/25      . Hydrocodone-Homatropine (HYCODAN PO) Take 5 mLs by mouth every 6 (six) hours as needed (cough).      3/25 levothyroxine (SYNTHROID, LEVOTHROID) 175 MCG tablet Take 175 mcg by mouth daily before breakfast.      . loratadine (CLARITIN) 10 MG tablet Take 10 mg by mouth at bedtime.      Marland Kitchen losartan (COZAAR) 100 MG tablet Take 100 mg by mouth every morning.      . montelukast (SINGULAIR) 10 MG tablet Take 10 mg  by mouth at bedtime.      . thiamine 100 MG tablet Take 100 mg by mouth every evening.      . traZODone (DESYREL) 50 MG tablet Take 200 mg by mouth at bedtime.      Marland Kitchen warfarin (COUMADIN) 1 MG tablet Take 1 mg by mouth every evening. Takes along with the 2.5mg  to equal 3.5mg       . warfarin (COUMADIN) 5 MG tablet Take 2.5 mg by mouth every evening. Takes along with a 1mg  tablet to equal 3.5mg       . Xylometazoline HCl (4-WAY NASAL SPRAY NA) Place 1 spray into both nostrils 4 (four) times daily as needed (congestion).      zolpidem (AMBIEN) 5 MG tablet Take 5 mg by mouth at bedtime.       Assessment: 80 yo male admitted with stroke like symptoms.  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  We have been asked to monitor his warfarin therapy while he is hospitalized.  Today his INR remains supratherapeutic at 3.44.  Note per Neurology that he may be converted to Xarelto for anticoagulation.     HOME:  Total dose is 3.5mg  daily of Warfarin  Goal of Therapy:  INR 2-3 Monitor platelets by anticoagulation protocol: Yes   Plan:  No Coumadin today Daily PT/INR monitoring Note potential plan for conversion to Xarelto - Xarelto should not be initiated until his INR is <  3.  78, Pharm.D., BCPS, AAHIVP Clinical Pharmacist Phone: [phone] Pager: [phone] 08/02/2013, 9:24 AM

## 2013-08-02 NOTE — Progress Notes (Signed)
Patient Name: Charles Berg Date of Encounter: 08/02/2013  Active Problems:   Stroke   HTN (hypertension)   HLD (hyperlipidemia)   Rheumatoid arthritis   Unspecified hypothyroidism   Hiatal hernia   Personal history of colonic polyps   CAD (coronary artery disease), native coronary artery   Paroxysmal atrial fibrillation   Insomnia   Sick sinus syndrome   Aneurysm of unspecified site    Patient Profile: 80 yo male w/ hx PAF (hx loop for syncope) and HTN, followed by Dr. Lady Gary, was admitted 04/18 w/ possible CVA. Loop interrogated w/ 1 episode PAF several months ago.   SUBJECTIVE: No syncope/presyncope, no symptoms w/ HR 50s.  OBJECTIVE Filed Vitals:   08/01/13 1700 08/01/13 2105 08/02/13 0603 08/02/13 1103  BP: 143/55 143/70 142/58 133/58  Pulse: 48 49 49 43  Temp: 98 F (36.7 C) 98 F (36.7 C) 97.8 F (36.6 C) 97.4 F (36.3 C)  TempSrc: Oral Oral Oral Oral  Resp: 16 19 18 18   Height:      Weight:      SpO2: 96% 95% 95% 98%    Intake/Output Summary (Last 24 hours) at 08/02/13 1229 Last data filed at 08/02/13 0900  Gross per 24 hour  Intake    460 ml  Output    100 ml  Net    360 ml   Filed Weights   07/31/13 1041  Weight: 204 lb (92.534 kg)    PHYSICAL EXAM General: Well developed, well nourished, male in no acute distress. Head: Normocephalic, atraumatic.  Neck: Supple without bruits, JVD not elevated. Lungs:  Resp regular and unlabored, few rales bases. Heart: RRR, S1, S2, no S3, S4, or murmur; no rub. Abdomen: Soft, non-tender, non-distended, BS + x 4.  Extremities: No clubbing, cyanosis, no edema.  Neuro: Alert and oriented X 3. Moves all extremities spontaneously. Psych: Normal affect.  LABS: CBC:  Recent Labs  07/31/13 1055 07/31/13 1100  WBC  --  4.2  NEUTROABS  --  3.0  HGB 11.6* 12.2*  HCT 34.0* 36.9*  MCV  --  91.1  PLT  --  140*   INR:  Recent Labs  08/02/13 0425  INR 3.44*   Basic Metabolic Panel: Recent Labs  07/31/13 1100  08/01/13 1028 08/02/13 0425  NA 143  --  142  --   K 4.0  --  4.3  --   CL 106  --  107  --   CO2 24  --  25  --   GLUCOSE 83  --  100*  --   BUN 24*  --  22  --   CREATININE 1.11  --  1.00  --   CALCIUM 9.0  --  8.7  --   MG  --   < > 1.9 1.9  < > = values in this interval not displayed. Liver Function Tests:  Recent Labs  07/31/13 1100  AST 21  ALT 26  ALKPHOS 66  BILITOT 0.3  PROT 6.2  ALBUMIN 3.5   Cardiac Enzymes:  Recent Labs  07/31/13 1712 07/31/13 2230 08/01/13 0635  TROPONINI <0.30 <0.30 <0.30    Recent Labs  07/31/13 1108  TROPIPOC 0.00   BNP: Pro B Natriuretic peptide (BNP)  Date/Time Value Ref Range Status  07/31/2013  8:10 PM 357.7  0 - 450 pg/mL Final   Hemoglobin A1C:  Recent Labs  08/01/13 0635  HGBA1C 5.0   Fasting Lipid Panel:  Recent Labs  07/31/13 2010  CHOL 119  HDL 64  LDLCALC 46  TRIG 44  CHOLHDL 1.9   Thyroid Function Tests:  Recent Labs  07/31/13 2010  TSH 0.888    TELE:  SR, S brady  Rate drops to high 30s at times, not necessarily while pt asleep    ECHO: 08/01/2013  Study Conclusions - Left ventricle: The cavity size was mildly dilated. Wall thickness was normal. The estimated ejection fraction was 55%. Wall motion was normal; there were no regional wall motion abnormalities. Left ventricular diastolic function parameters were normal. - Aortic valve: There was no stenosis. Trivial regurgitation. - Mitral valve: Mild regurgitation. - Left atrium: The atrium was mildly dilated. - Right ventricle: The cavity size was normal. Systolic function was normal. - Tricuspid valve: Peak RV-RA gradient: 54mm Hg (S). - Pulmonary arteries: PA peak pressure: 6mm Hg (S). - Systemic veins: IVC measured 2.6 cm with>50% respirophasic variation, suggesting RA pressure 8 mmHg. Impressions: - Mildly dilated LV with EF 55%. Normal diastolic function. Mild LAE. Mild MR. Normal RV size and systolic  function. Mild pulmonary hypertension.   Radiology/Studies: Dg Chest 2 View 07/31/2013   CLINICAL DATA:  Stroke.  EXAM: CHEST  2 VIEW  COMPARISON:  None.  FINDINGS: Mild hyperinflation. Lateral view degraded by patient arm position. Borderline loss of thoracic vertebral body height at multiple levels. Favored to be within normal variation.  Telemetry device projects over the left side of the mediastinum on the frontal.  Patient rotated right. Midline trachea. Normal heart size and mediastinal contours for age. No pleural effusion or pneumothorax. Biapical pleural parenchymal scarring. Mild volume loss at the left lung base.  IMPRESSION: Hyperinflation and pleural parenchymal scarring.  No acute findings.   Electronically Signed   By: Jeronimo Greaves M.D.   On: 07/31/2013 15:23   Mr Brain Wo Contrast 07/31/2013   CLINICAL DATA:  80 year old male with slurred speech and facial droop. Difficulty walking. Code stroke.  EXAM: MRI HEAD WITHOUT CONTRAST  MRA HEAD WITHOUT CONTRAST  TECHNIQUE: Multiplanar, multiecho pulse sequences of the brain and surrounding structures were obtained without intravenous contrast. Angiographic images of the head were obtained using MRA technique without contrast.  COMPARISON:  Non contrast head CT 1048 hr the same day.  FINDINGS: MRI HEAD FINDINGS  Cerebral volume is within normal limits for age. No restricted diffusion to suggest acute infarction. No midline shift, mass effect, evidence of mass lesion, ventriculomegaly, extra-axial collection or acute intracranial hemorrhage. Cervicomedullary junction and pituitary are within normal limits.  Decreased distal left vertebral artery flow void (series 11, image 14). Other Major intracranial vascular flow voids are preserved.  Small focus of chronic blood products in the right parietal lobe (series 9, image 121) without associated T2 or FLAIR hyperintensity. Elsewhere normal for age gray and white matter throughout the brain.  Negative  visualized cervical spine. Normal bone marrow signal. Visualized orbit soft tissues are within normal limits. Minor paranasal sinus mucosal thickening. Mastoids are clear. Visible internal auditory structures appear normal. Visualized scalp soft tissues are within normal limits.  MRA HEAD FINDINGS  Absent antegrade flow in the distal left vertebral artery, instilled just before the vertebrobasilar junction. Preserved antegrade flow in the distal right vertebral artery. Normal right PICA origin.  The basilar artery is patent without stenosis. SCA and PCA origins are normal. Posterior communicating arteries are diminutive or absent. Bilateral PCA branches are within normal limits.  Antegrade flow in both ICA siphons. Tortuous distal cervical left ICA. No  ICA stenosis. Normal ophthalmic artery origins. Unusual fenestrated appearance of the left ACA A1 segment. Normal carotid termini. Normal MCA and ACA origins otherwise. Anterior communicating artery remarkable for a 3 mm aneurysm directed inferiorly (series 5, images 89 and 90).  Other visualized ACA branches are within normal limits. Visualized bilateral MCA branches are within normal limits.  IMPRESSION: 1. No acute infarct. 2. 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 Favor chronic in light of no signal changes in the left cerebellum. 3. 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 4. Negative MRI appearance of the brain parenchyma aside from a small focus of chronic hemorrhage in the right parietal lobe. Study discussed by telephone with Dr. Ritta Slot on 07/31/2013 at 15:05 .   Electronically Signed   By: Augusto Gamble M.D.   On: 07/31/2013 15:05     Current Medications:  . amLODipine  10 mg Oral Q0600  . atorvastatin  20 mg Oral QPM  . celecoxib  100 mg Oral Q M,W,F  . cyanocobalamin  500 mcg Oral QPM  . donepezil  10 mg Oral QHS  . dutasteride  0.5 mg Oral QPM   And  .  tamsulosin  0.4 mg Oral QPC supper  . folic acid  1 mg Oral Daily  . levothyroxine  175 mcg Oral QAC breakfast  . loratadine  10 mg Oral QHS  . losartan  100 mg Oral Daily  . montelukast  10 mg Oral QHS  . thiamine  100 mg Oral QPM  . traZODone  200 mg Oral QHS  . Warfarin - Pharmacist Dosing Inpatient   Does not apply q1800  . zolpidem  5 mg Oral QHS   . sodium chloride 100 mL/hr at 08/02/13 0807    ASSESSMENT AND PLAN:   Bradycardia - chronic, asymptomatic, not on rate-lowering meds although was on amio till admission. No other rate-lowering medications although Aricept is associated with bradycardia. The incidence is very low. Pt tolerating HR 30s. MD advise on ambulation to ck for chronotropic incompetence.    Paroxysmal atrial fibrillation - [employer] ILR interrogated, afib x 20 minutes several months ago, o/w no episodes or other arrhythmia. Amiodarone held on admission, MD advise on resuming it.     Sick sinus syndrome - hx PPM in the past, removed due to infection, Dr. Maisie Fus at [facility] did not feel one needed  Active Problems:   Stroke - MRI is abnormal, per IM/Neuro    HTN (hypertension) - SBP 130s-150s, per IM    HLD (hyperlipidemia) - on statin    Rheumatoid arthritis - per IM    Unspecified hypothyroidism - TSH OK, per IM    Hiatal hernia - per IM    Personal history of colonic polyps - per IM    Insomnia - per IM    Aneurysm of unspecified site - per IM/Neuro    CAD, non-obstructive by remote cath - no acute ischemic symptoms   Signed, Darrol Jump , PA-C 12:29 PM 08/02/2013 Patient seen and examined and history reviewed. Agree with above findings and plan. Telemetry reviewed. NSR with sinus brady. HR 40-55 bpm. No significant pauses. Agree with stopping amiodarone. No indication for pacemaker at this time since patient is asymptomatic. He can follow up with Dr. Mariel Kansky in [place]. Please call with other questions- otherwise we will sign off.  Demetria Pore [facility] 08/02/2013 1:30 PM

## 2013-08-02 NOTE — Progress Notes (Signed)
Stroke Team Progress Note  HISTORY Charles Berg is a 80 y.o. male with a history of atrial fibrillation on Coumadin who was in his normal state on awakening the morning of 07/31/2013. While making breakfast (waffles) he was speaking with his wife and she noticed that his speech become markedly slurred. She then called 911 out of concern for stroke. Initially 9:30 AM was page as LKW, but this is when she noticed the abnormality; she last saw him normal at 8:40 AM.  tpa was not given as mild symptoms, anticoagulated  SUBJECTIVE The patient's wife is at the bedside. Patient complains of rash on the back.   OBJECTIVE Most recent Vital Signs: Filed Vitals:   08/01/13 1435 08/01/13 1700 08/01/13 2105 08/02/13 0603  BP: 131/57 143/55 143/70 142/58  Pulse: 47 48 49 49  Temp: 98.1 F (36.7 C) 98 F (36.7 C) 98 F (36.7 C) 97.8 F (36.6 C)  TempSrc: Oral Oral Oral Oral  Resp: 16 16 19 18   Height:      Weight:      SpO2: 96% 96% 95% 95%   CBG (last 3)   Recent Labs  07/31/13 1100 07/31/13 1823 07/31/13 2150  GLUCAP 82 96 103*    IV Fluid Intake:   . sodium chloride 100 mL/hr at 08/02/13 0807    MEDICATIONS  . amLODipine  10 mg Oral Q0600  . atorvastatin  20 mg Oral QPM  . celecoxib  100 mg Oral Q M,W,F  . cyanocobalamin  500 mcg Oral QPM  . donepezil  10 mg Oral QHS  . dutasteride  0.5 mg Oral QPM   And  . tamsulosin  0.4 mg Oral QPC supper  . folic acid  1 mg Oral Daily  . levothyroxine  175 mcg Oral QAC breakfast  . loratadine  10 mg Oral QHS  . losartan  100 mg Oral Daily  . montelukast  10 mg Oral QHS  . thiamine  100 mg Oral QPM  . traZODone  200 mg Oral QHS  . Warfarin - Pharmacist Dosing Inpatient   Does not apply q1800  . zolpidem  5 mg Oral QHS   PRN:  hydrALAZINE, oxyCODONE-acetaminophen, senna-docusate  Diet:  Cardiac thin liquids Activity:  Bedrest  DVT Prophylaxis:  Warfarin  CLINICALLY SIGNIFICANT STUDIES Basic Metabolic Panel:  Recent Labs Lab  07/31/13 1100  08/01/13 1028 08/02/13 0425  NA 143  --  142  --   K 4.0  --  4.3  --   CL 106  --  107  --   CO2 24  --  25  --   GLUCOSE 83  --  100*  --   BUN 24*  --  22  --   CREATININE 1.11  --  1.00  --   CALCIUM 9.0  --  8.7  --   MG  --   < > 1.9 1.9  < > = values in this interval not displayed. Liver Function Tests:   Recent Labs Lab 07/31/13 1100  AST 21  ALT 26  ALKPHOS 66  BILITOT 0.3  PROT 6.2  ALBUMIN 3.5   CBC:   Recent Labs Lab 07/31/13 1055 07/31/13 1100  WBC  --  4.2  NEUTROABS  --  3.0  HGB 11.6* 12.2*  HCT 34.0* 36.9*  MCV  --  91.1  PLT  --  140*   Coagulation:   Recent Labs Lab 07/31/13 1100 08/01/13 0635 08/02/13 0425  LABPROT 27.8* 34.0*  33.4*  INR 2.71* 3.52* 3.44*   Cardiac Enzymes:   Recent Labs Lab 07/31/13 1712 07/31/13 2230 08/01/13 0635  TROPONINI <0.30 <0.30 <0.30   Urinalysis:   Recent Labs Lab 07/31/13 1229 07/31/13 1911  COLORURINE YELLOW YELLOW  LABSPEC 1.013 1.015  PHURINE 6.5 6.5  GLUCOSEU NEGATIVE NEGATIVE  HGBUR NEGATIVE NEGATIVE  BILIRUBINUR NEGATIVE NEGATIVE  KETONESUR NEGATIVE 15*  PROTEINUR NEGATIVE NEGATIVE  UROBILINOGEN 0.2 0.2  NITRITE NEGATIVE NEGATIVE  LEUKOCYTESUR TRACE* NEGATIVE   Lipid Panel    Component Value Date/Time   CHOL 119 07/31/2013 2010   TRIG 44 07/31/2013 2010   HDL 64 07/31/2013 2010   CHOLHDL 1.9 07/31/2013 2010   VLDL 9 07/31/2013 2010   LDLCALC 46 07/31/2013 2010   HgbA1C  Lab Results  Component Value Date   HGBA1C 5.0 08/01/2013    Urine Drug Screen:     Component Value Date/Time   LABOPIA NONE DETECTED 07/31/2013 1229   COCAINSCRNUR NONE DETECTED 07/31/2013 1229   LABBENZ NONE DETECTED 07/31/2013 1229   AMPHETMU NONE DETECTED 07/31/2013 1229   THCU NONE DETECTED 07/31/2013 1229   LABBARB NONE DETECTED 07/31/2013 1229    Alcohol Level: No results found for this basename: ETH,  in the last 168 hours  Dg Chest 2 View 07/31/2013    Hyperinflation and pleural  parenchymal scarring.  No acute findings.    Ct Head Wo Contrast 07/31/2013    Normal for age non contrast CT appearance of the brain.    MRI/A Head Wo Contrast 07/31/2013    1. No acute infarct. 2. 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 Favor chronic in light of no signal changes in the left cerebellum. 3. 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 4. Negative MRI appearance of the brain parenchyma aside from a small focus of chronic hemorrhage in the right parietal lobe.   2D Echocardiogram  Mildly dilated LV with EF 55%. Normal diastolic function. Mild LAE. Mild MR. Normal RV size and systolic function. Mild pulmonary hypertension.  Carotid Doppler     EKG  sinus bradycardia rate 51 beats per minute. For complete results please see formal report.   Therapy Recommendations none  Physical Exam   Mental Status:  Patient is awake, alert, oriented to person, place, month, year, and situation.  Immediate and remote memory are intact.  Patient is able to give a clear and coherent history.  No signs of aphasia or neglect  He is dysarthric  Cranial Nerves:  II: Visual Fields are full. Pupils are equal, round, and reactive to light. Discs are difficult to visualize.  III,IV, VI: EOMI without ptosis or diploplia.  V: Facial sensation is decreased on the left to pin  VII: Facial movement is notable for left facial weakness  VIII: hearing is intact to voice  X: Uvula elevates symmetrically  XI: Shoulder shrug is symmetric.  XII: tongue is midline without atrophy or fasciculations.  Motor:  Tone is normal. Bulk is normal. 5/5 strength was present in all four extremities.  Sensory:  Sensation is symmetric to light touch and temperature in the arms and legs.  Deep Tendon Reflexes:  2+ and symmetric in the biceps and patellae.  Plantars:  Toes are downgoing bilaterally.  Cerebellar:  FNF and HKS are intact  bilaterally  Gait: Not assessed   ASSESSMENT [name] is a 80 y.o. male presenting with dysarthria. TPA was not administered since  the patient was on warfarin for atrial fibrillation. INR at time of admission 2.71. CT and MRI showed no acute infarct. Dx:  Right brain TIA. On warfarin prior to admission. Now on warfarin for secondary stroke prevention. Patient with resultant dysarthria. Stroke work up underway.   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  3 mm anterior communicating artery aneurysm -incidental finding  Small focus of chronic hemorrhage in the right parietal lobe.  History of paroxysmal atrial fibrillation  Chronic warfarin therapy - INR 3.52 today. ( Patient also on Celebrex )   SSS - may need pacemaker - [facility]  Hypertension history  Hyperlipidemia history - has been on Lipitor - cholesterol 119; LDL 46  Question early dementia - on Aricept  Bradycardia - heart rate in the 40s    Hospital day # 2  TREATMENT/PLAN  Continue warfarin for now. Consider changing to NOAC in the future; patient would like to discuss with cardiology, primary before making the change.  F/u carotid Dopplers.  No therapy needs  Ok for discharge once carotids completed  Follow up with Dr. Pearlean Brownie, Stroke Clinic, in 2 months.  Annie Main, MSN, RN, ANVP-BC, ANP-BC, [facility] Pager: [phone] 08/02/2013 12:30 PM  I have personally obtained a history, examined the patient, evaluated imaging results, and formulated the assessment and plan of care. I agree with the above. Delia Heady, MD  To contact Stroke Continuity provider, please refer to [email or web address]. After hours, contact General Neurology

## 2013-08-02 NOTE — Evaluation (Signed)
Speech Language Pathology Evaluation Patient Details Name: Charles Berg MRN: [id number] DOB: 09-04-1931 Today's Date: 08/02/2013 Time: 8144-8185 SLP Time Calculation (min): 21 min  Problem List:  Patient Active Problem List   Diagnosis Date Noted  . Stroke 07/31/2013  . HTN (hypertension) 07/31/2013  . HLD (hyperlipidemia) 07/31/2013  . Rheumatoid arthritis 07/31/2013  . Unspecified hypothyroidism 07/31/2013  . Hiatal hernia 07/31/2013  . Personal history of colonic polyps 07/31/2013  . CAD (coronary artery disease) of artery bypass graft 07/31/2013  . Paroxysmal atrial fibrillation 07/31/2013  . Insomnia 07/31/2013  . Sick sinus syndrome 07/31/2013  . Aneurysm of unspecified site 07/31/2013   Past Medical History: History reviewed. No pertinent past medical history. Past Surgical History: History reviewed. No pertinent past surgical history. HPI:  80 yo male adm to [facility] with slurred speech and left facial droop.  Stroke work up underway.  MRI negative except right parietal lobe chronic hemorrhage.  SLE ordered.     Assessment / Plan / Recommendation Clinical Impression  Pt with intact language skills both expressive and receptive as well as speech abilities.  He named 13 animals in 60 seconds with minimum detailed category cues.  Pt and spouse admit to pt's baseline cognitive issue for which he takes medications and is followed by neurologist in [place].    He manages his medications at home, manages health insurance tasks and cooks dinner 3 xs a week while his wife babysits.  SLP advised pt to occasionally have his wife double check his medications due to his memory impairments and use the microwave in lieu of stove top for safety if indicated.    Per pt and spouse, he is at his baseline level of function.  No skilled SLP intervention warranted at this time.    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, spouse babysits grandchildren Vocation: Retired   Associate Professor  Overall Cognitive Status: Within Advertising copywriter for tasks assessed Arousal/Alertness: Awake/alert Orientation Level: Oriented X4 Attention: Sustained;Selective Sustained Attention: Appears intact Selective Attention: Appears intact Memory: Impaired (baseline memory defictis) Awareness: Appears intact Problem Solving: Appears intact Safety/Judgment: Appears intact    Comprehension  Auditory Comprehension Overall Auditory Comprehension: Appears within functional limits for tasks assessed Yes/No Questions: Not tested Commands: Within Functional Limits Conversation: Complex Visual Recognition/Discrimination Discrimination: Within Function Limits Reading Comprehension Reading Status: Not tested    Expression Expression Primary Mode of Expression: Verbal Verbal Expression Overall Verbal Expression: Appears within functional limits for tasks assessed Initiation: No impairment Naming: Not tested Pragmatics: No impairment Non-Verbal Means of Communication: Not applicable Other Verbal Expression Comments: pt able to describe how he met his spouse demonstrating intact expressive language including semantics, syntax and pragmatics Written Expression Written Expression: Not tested   Oral / Motor Oral Motor/Sensory Function Overall Oral Motor/Sensory Function: Appears within functional limits for tasks assessed Motor Speech Overall Motor Speech:  Appears within functional limits for tasks assessed   [place], [place] [facility] SLP [phone]

## 2013-08-02 NOTE — Evaluation (Signed)
Occupational Therapy Evaluation Patient Details Name: Charles Berg MRN: [id number] DOB: 21-Aug-1931 Today's Date: 08/02/2013    History of Present Illness 80 y.o. male admitted with dx of stroke and bradycardia.   Clinical Impression   Pt moving well. Education provided to pt and spouse. Feel pt is safe to d/c home with spouse available to assist.     Follow Up Recommendations  No OT follow up;Supervision - Intermittent  (recommended pt get eyes checked at opthamologist)   Equipment Recommendations  Tub/shower seat    Recommendations for Other Services       Precautions / Restrictions Restrictions Weight Bearing Restrictions: No      Mobility Bed Mobility Overal bed mobility: Modified Independent                Transfers Overall transfer level: Modified independent Equipment used: None                  Balance                                            ADL Overall ADL's : Needs assistance/impaired             Lower Body Bathing: Supervison/ safety (standing)       Lower Body Dressing: Supervision/safety;Sit to/from stand   Toilet Transfer: Supervision/safety;Ambulation;Regular Toilet;Grab bars (pt has vanity near at home)       Tub/ Shower Transfer: Min guard;Ambulation (practiced stepping over) Tub/Shower Transfer Details (indicate cue type and reason): ' Functional mobility during ADLs: Supervision/safety (holding onto objects in room) General ADL Comments: Educated on signs/symptoms of stroke and wrote down for pt and spouse. Educated on importance of getting help right away and recommended avoiding canned foods. Recommended sitting for bathing on chair and sitting to get dressed. Pt reports falling in bathoom before. Educated on safety tips for home (rugs, safe shoewear, clutter). Tested pt's vision-a little difficulty with saccades. Told pt activities he could do at home for this. Recommended pt get eyes checked at  opthamologist and to ask MD about driving. Talked about using cane in home versus furniture walking. Recommended wife be with pt for shower transfer first few times to be sure he is safe. Educated on safe technique.     Vision       Tracking/Visual Pursuits: Able to track stimulus in all quads without difficulty Saccades: Other (comment) (difficult)           Perception     Praxis      Pertinent Vitals/Pain No pain reported.     Hand Dominance     Extremity/Trunk Assessment Upper Extremity Assessment Upper Extremity Assessment: Overall WFL for tasks assessed           Communication Communication Communication: No difficulties   Cognition Arousal/Alertness: Awake/alert Behavior During Therapy: WFL for tasks assessed/performed Overall Cognitive Status: Within Functional Limits for tasks assessed       Memory:  (reports decreased memory)             General Comments       Exercises       Shoulder Instructions      Home Living Family/patient expects to be discharged to:: Private residence Living Arrangements: Spouse/significant other Available Help at Discharge: Family;Available 24 hours/day Type of Home: House Home Access: Stairs to enter [employer] of Steps: 2 Entrance Stairs-Rails: None  Home Layout: One level     Bathroom Shower/Tub: Chief Strategy Officer: Standard     Home Equipment: Cane - single point          Prior Functioning/Environment Level of Independence: Independent             OT Diagnosis:     OT Problem List:     OT Treatment/Interventions:      OT Goals(Current goals can be found in the care plan section) Acute Rehab OT Goals Patient Stated Goal: home  OT Frequency:     Barriers to D/C:            Co-evaluation              End of Session Equipment Utilized During Treatment: Gait belt  Activity Tolerance: Patient tolerated treatment well Patient left: in bed;with call  bell/phone within reach;with family/visitor present   Time: 5364-6803 OT Time Calculation (min): 42 min Charges:  OT General Charges $OT Visit: 1 Procedure OT Evaluation $Initial OT Evaluation Tier I: 1 Procedure OT Treatments $Self Care/Home Management : 8-22 mins G-Codes:    Earlie Raveling OTR/L [phone] 08/02/2013, 10:20 AM

## 2013-08-02 NOTE — Progress Notes (Signed)
[facility] PROGRESS NOTE  MERWYN HODAPP WUJ:[id number] DOB: Dec 15, 1931 DOA: 07/31/2013 PCP: Daniel Nones  Assessment/Plan:  Sick sinus syndrome?  - Continue to hold amiodarone per cardiology. - 4/19 carotid doppler and 2d echo done, both unremarkable - Cardiology following  Paroxysmal atrial fibrillation  -Patient currently in sinus bradycardia.   -Continue Coumadin per pharmacy -Supertherapeutic today   HTN  -Continue amlodipine 10 mg  -Continue losartan 100 mg  -Hydralazine IV when necessary SBP>160 or DBP> 100   Hypothyroidism  -TSH; within normal limits  -continue Synthroid 175 mcg daily -free T4 w/in normal limits  Coronary artery disease  -Obtain troponin x3 (negative) -Obtain proBNP mildly elevated at 357  -Echocardiogram unremarkable. No WMA  HLD  -lipid panel; within AHA guideline   Rheumatoid arthritis  -Continue patient's Percocet 5/325 mg Q6 hr PRN pain  -Will hold gabapentin as patient and wife unsure why she is on this medication   Insomnia  -Continue trazodone 200 mg QHS   Aneurysm  -Patient has newly diagnosed aneurysm 3 mm; considered low risk for rupture will need reimaging in 6 months.   Code Status: Full  Family Communication: Family at bedside  Disposition Plan: Completion of cardiology work up and improvement in INR   Procedure  MRI brain without contrast 07/31/2013  -No acute infarct.  - Likely Slow flow/occlusion of distal left vertebral artery.  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  Per care everywhere Dr. Gerre Pebbles (electrophysiology [facility])  October 2006 cardiac catheterization 40% LAD, 40% RCA, and 25% lesions in other vessels.    Antibiotics   Consultation Dr Franky Macho Kohan(Cardiology)   HPI/Subjective: DOMINIQUE CALVEY is a 80 y.o. male WM PMHx symptomatic bradycardia with pacer placement 2012/remove 2 weeks later secondary to infection,  S/P subcutaneous loop recorder. States since 2012 has been on rate control medication amiodarone 200 mg BID, without side effect. On further questioning the daughter and wife admit that on numerous occasions patient has had presyncope and had to sit down, and has had one recent occurrence of syncope. Review of care everywhere; shows patient was last seen by Dr. Gerre Pebbles electrophysiology [facility] January 2010 at which time he was believed to be suffering from sick sinus syndrome. At that time patient's Cardizem and Toprol were discontinued and patient was continued on amiodarone for rhythm control. Was felt at that time patient may need a pacemaker instead of atrial fibrillation ablation.  Presented to the emergency department with acute onset of difficulty speaking abnormal gait while cooking breakfast this morning. The patient, states, that he does not feel, weakness, in his extremities, but and had trouble walking and speaking. Patient, states, that the chest pain, shortness of breath, nausea, vomiting, diarrhea, headache, blurred vision, dizziness, fever, abdominal pain, or syncope. The patient states, that he has not had a stroke in the past. The patient does have some confusion as well. Patient wife provided a list showing that patient S/P cardioversion 08/05/2007 and 01/30/2008, S/P CardioNet monitor January 2010 and 06/08/2010  4/19 patient's HR overnight high 30s- low 40s asymptomatic (note patient has not been out of bed), negative dizziness/ CP/ SOB.  No complaints overnight. Pt is eager to go home   Objective: Filed Vitals:   08/01/13 1700 08/01/13 2105 08/02/13 0603 08/02/13 1103  BP: 143/55 143/70 142/58 133/58  Pulse: 48 49 49 43  Temp: 98 F (36.7 C) 98 F (36.7 C) 97.8 F (  36.6 C) 97.4 F (36.3 C)  TempSrc: Oral Oral Oral Oral  Resp: 16 19 18 18   Height:      Weight:      SpO2: 96% 95% 95% 98%    Intake/Output Summary (Last 24 hours) at 08/02/13  1127 Last data filed at 08/02/13 0900  Gross per 24 hour  Intake    460 ml  Output    100 ml  Net    360 ml   Filed Weights   07/31/13 1041  Weight: 92.534 kg (204 lb)    Exam:   General:  A./O. x4, NAD  Cardiovascular: Bradycardic, regular rhythm, negative murmurs rubs or gallops  Respiratory: Clear to auscultation bilateral  Abdomen: Soft, nontender, nondistended, plus bowel sound  Musculoskeletal: Negative pedal edema   Data Reviewed: Basic Metabolic Panel:  Recent Labs Lab 07/31/13 1055 07/31/13 1100 07/31/13 2010 08/01/13 0635 08/01/13 1028 08/02/13 0425  NA 141 143  --   --  142  --   K 3.8 4.0  --   --  4.3  --   CL 105 106  --   --  107  --   CO2  --  24  --   --  25  --   GLUCOSE 81 83  --   --  100*  --   BUN 22 24*  --   --  22  --   CREATININE 1.20 1.11  --   --  1.00  --   CALCIUM  --  9.0  --   --  8.7  --   MG  --   --  1.8 1.9 1.9 1.9   Liver Function Tests:  Recent Labs Lab 07/31/13 1100  AST 21  ALT 26  ALKPHOS 66  BILITOT 0.3  PROT 6.2  ALBUMIN 3.5   No results found for this basename: LIPASE, AMYLASE,  in the last 168 hours No results found for this basename: AMMONIA,  in the last 168 hours CBC:  Recent Labs Lab 07/31/13 1055 07/31/13 1100  WBC  --  4.2  NEUTROABS  --  3.0  HGB 11.6* 12.2*  HCT 34.0* 36.9*  MCV  --  91.1  PLT  --  140*   Cardiac Enzymes:  Recent Labs Lab 07/31/13 1712 07/31/13 2230 08/01/13 0635  TROPONINI <0.30 <0.30 <0.30   BNP (last 3 results)  Recent Labs  07/31/13 2010  PROBNP 357.7   CBG:  Recent Labs Lab 07/31/13 1100 07/31/13 1823 07/31/13 2150  GLUCAP 82 96 103*    No results found for this or any previous visit (from the past 240 hour(s)).   Studies: Dg Chest 2 View  07/31/2013   CLINICAL DATA:  Stroke.  EXAM: CHEST  2 VIEW  COMPARISON:  None.  FINDINGS: Mild hyperinflation. Lateral view degraded by patient arm position. Borderline loss of thoracic vertebral body  height at multiple levels. Favored to be within normal variation.  Telemetry device projects over the left side of the mediastinum on the frontal.  Patient rotated right. Midline trachea. Normal heart size and mediastinal contours for age. No pleural effusion or pneumothorax. Biapical pleural parenchymal scarring. Mild volume loss at the left lung base.  IMPRESSION: Hyperinflation and pleural parenchymal scarring.  No acute findings.   Electronically Signed   By: 08/02/2013 M.D.   On: 07/31/2013 15:23   Mr 08/02/2013 Head Wo Contrast  07/31/2013   CLINICAL DATA:  80 year old male with slurred speech and facial droop.  Difficulty walking. Code stroke.  EXAM: MRI HEAD WITHOUT CONTRAST  MRA HEAD WITHOUT CONTRAST  TECHNIQUE: Multiplanar, multiecho pulse sequences of the brain and surrounding structures were obtained without intravenous contrast. Angiographic images of the head were obtained using MRA technique without contrast.  COMPARISON:  Non contrast head CT 1048 hr the same day.  FINDINGS: MRI HEAD FINDINGS  Cerebral volume is within normal limits for age. No restricted diffusion to suggest acute infarction. No midline shift, mass effect, evidence of mass lesion, ventriculomegaly, extra-axial collection or acute intracranial hemorrhage. Cervicomedullary junction and pituitary are within normal limits.  Decreased distal left vertebral artery flow void (series 11, image 14). Other Major intracranial vascular flow voids are preserved.  Small focus of chronic blood products in the right parietal lobe (series 9, image 121) without associated T2 or FLAIR hyperintensity. Elsewhere normal for age gray and white matter throughout the brain.  Negative visualized cervical spine. Normal bone marrow signal. Visualized orbit soft tissues are within normal limits. Minor paranasal sinus mucosal thickening. Mastoids are clear. Visible internal auditory structures appear normal. Visualized scalp soft tissues are within normal limits.   MRA HEAD FINDINGS  Absent antegrade flow in the distal left vertebral artery, instilled just before the vertebrobasilar junction. Preserved antegrade flow in the distal right vertebral artery. Normal right PICA origin.  The basilar artery is patent without stenosis. SCA and PCA origins are normal. Posterior communicating arteries are diminutive or absent. Bilateral PCA branches are within normal limits.  Antegrade flow in both ICA siphons. Tortuous distal cervical left ICA. No ICA stenosis. Normal ophthalmic artery origins. Unusual fenestrated appearance of the left ACA A1 segment. Normal carotid termini. Normal MCA and ACA origins otherwise. Anterior communicating artery remarkable for a 3 mm aneurysm directed inferiorly (series 5, images 89 and 90).  Other visualized ACA branches are within normal limits. Visualized bilateral MCA branches are within normal limits.  IMPRESSION: 1. No acute infarct. 2. 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 Favor chronic in light of no signal changes in the left cerebellum. 3. 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 4. Negative MRI appearance of the brain parenchyma aside from a small focus of chronic hemorrhage in the right parietal lobe. Study discussed by telephone with Dr. Ritta Slot on 07/31/2013 at 15:05 .   Electronically Signed   By: Augusto Gamble M.D.   On: 07/31/2013 15:05   Mr Brain Wo Contrast  07/31/2013   CLINICAL DATA:  80 year old male with slurred speech and facial droop. Difficulty walking. Code stroke.  EXAM: MRI HEAD WITHOUT CONTRAST  MRA HEAD WITHOUT CONTRAST  TECHNIQUE: Multiplanar, multiecho pulse sequences of the brain and surrounding structures were obtained without intravenous contrast. Angiographic images of the head were obtained using MRA technique without contrast.  COMPARISON:  Non contrast head CT 1048 hr the same day.  FINDINGS: MRI HEAD FINDINGS  Cerebral volume  is within normal limits for age. No restricted diffusion to suggest acute infarction. No midline shift, mass effect, evidence of mass lesion, ventriculomegaly, extra-axial collection or acute intracranial hemorrhage. Cervicomedullary junction and pituitary are within normal limits.  Decreased distal left vertebral artery flow void (series 11, image 14). Other Major intracranial vascular flow voids are preserved.  Small focus of chronic blood products in the right parietal lobe (series 9, image 121) without associated T2 or FLAIR hyperintensity. Elsewhere normal for age gray and white matter throughout the brain.  Negative visualized  cervical spine. Normal bone marrow signal. Visualized orbit soft tissues are within normal limits. Minor paranasal sinus mucosal thickening. Mastoids are clear. Visible internal auditory structures appear normal. Visualized scalp soft tissues are within normal limits.  MRA HEAD FINDINGS  Absent antegrade flow in the distal left vertebral artery, instilled just before the vertebrobasilar junction. Preserved antegrade flow in the distal right vertebral artery. Normal right PICA origin.  The basilar artery is patent without stenosis. SCA and PCA origins are normal. Posterior communicating arteries are diminutive or absent. Bilateral PCA branches are within normal limits.  Antegrade flow in both ICA siphons. Tortuous distal cervical left ICA. No ICA stenosis. Normal ophthalmic artery origins. Unusual fenestrated appearance of the left ACA A1 segment. Normal carotid termini. Normal MCA and ACA origins otherwise. Anterior communicating artery remarkable for a 3 mm aneurysm directed inferiorly (series 5, images 89 and 90).  Other visualized ACA branches are within normal limits. Visualized bilateral MCA branches are within normal limits.  IMPRESSION: 1. No acute infarct. 2. 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 Favor chronic in light of no signal changes in the left  cerebellum. 3. 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 4. Negative MRI appearance of the brain parenchyma aside from a small focus of chronic hemorrhage in the right parietal lobe. Study discussed by telephone with Dr. Ritta Slot on 07/31/2013 at 15:05 .   Electronically Signed   By: Augusto Gamble M.D.   On: 07/31/2013 15:05    Scheduled Meds: . amLODipine  10 mg Oral Q0600  . atorvastatin  20 mg Oral QPM  . celecoxib  100 mg Oral Q M,W,F  . cyanocobalamin  500 mcg Oral QPM  . donepezil  10 mg Oral QHS  . dutasteride  0.5 mg Oral QPM   And  . tamsulosin  0.4 mg Oral QPC supper  . folic acid  1 mg Oral Daily  . levothyroxine  175 mcg Oral QAC breakfast  . loratadine  10 mg Oral QHS  . losartan  100 mg Oral Daily  . montelukast  10 mg Oral QHS  . thiamine  100 mg Oral QPM  . traZODone  200 mg Oral QHS  . Warfarin - Pharmacist Dosing Inpatient   Does not apply q1800  . zolpidem  5 mg Oral QHS   Continuous Infusions: . sodium chloride 100 mL/hr at 08/02/13 0807    Active Problems:   Stroke   HTN (hypertension)   HLD (hyperlipidemia)   Rheumatoid arthritis   Unspecified hypothyroidism   Hiatal hernia   Personal history of colonic polyps   CAD (coronary artery disease) of artery bypass graft   Paroxysmal atrial fibrillation   Insomnia   Sick sinus syndrome   Aneurysm of unspecified site    Time spent: 40 min   Jerald Kief  [facility] Pager [phone]. If 7PM-7AM, please contact night-coverage at [email or web address], password [facility] 08/02/2013, 11:27 AM  LOS: 2 days

## 2013-08-02 NOTE — ED Provider Notes (Signed)
Medical screening examination/treatment/procedure(s) were conducted as a shared visit with non-physician practitioner(s) and myself.  I personally evaluated the patient during the encounter.   EKG Interpretation   Date/Time:  Saturday July 31 2013 10:38:23 EDT Ventricular Rate:  51 PR Interval:  243 QRS Duration: 128 QT Interval:  572 QTC Calculation: 527 R Axis:   105 Text Interpretation:  Sinus rhythm Prolonged PR interval Nonspecific  intraventricular conduction delay Lateral infarct, old Confirmed by  Avilyn Virtue  MD, Yenesis Even ([id number]) on 07/31/2013 11:38:59 AM      I interviewed and examined the patient. Lungs are CTAB. Cardiac exam wnl. Abdomen soft.  Neurology seeing the patient. Plan for admission to Hospitalist.   Junius Argyle, MD 08/02/13 [phone]

## 2013-08-03 LAB — GLUCOSE, CAPILLARY
GLUCOSE-CAPILLARY: 96 mg/dL (ref 70–99)
GLUCOSE-CAPILLARY: 84 mg/dL (ref 70–99)

## 2013-08-03 LAB — CBC
HCT: 36.2 % — ABNORMAL LOW (ref 39.0–52.0)
Hemoglobin: 12 g/dL — ABNORMAL LOW (ref 13.0–17.0)
MCH: 29.8 pg (ref 26.0–34.0)
MCHC: 33.1 g/dL (ref 30.0–36.0)
MCV: 89.8 fL (ref 78.0–100.0)
PLATELETS: 127 10*3/uL — AB (ref 150–400)
RBC: 4.03 MIL/uL — AB (ref 4.22–5.81)
RDW: 14.3 % (ref 11.5–15.5)
WBC: 5 10*3/uL (ref 4.0–10.5)

## 2013-08-03 LAB — MAGNESIUM: Magnesium: 1.8 mg/dL (ref 1.5–2.5)

## 2013-08-03 LAB — PROTIME-INR
INR: 2.8 — AB (ref 0.00–1.49)
PROTHROMBIN TIME: 28.5 s — AB (ref 11.6–15.2)

## 2013-08-03 MED ORDER — WARFARIN SODIUM 4 MG PO TABS
4.0000 mg | ORAL_TABLET | Freq: Once | ORAL | Status: DC
  Filled 2013-08-03: qty 1

## 2013-08-03 NOTE — Progress Notes (Signed)
ANTICOAGULATION CONSULT NOTE - Follow Up  Pharmacy Consult for Warfarin Indication: Hx. Afib   Allergies  Allergen Reactions  . Aspirin Other (See Comments)    Yeast infection in mouth  . Sulfa Antibiotics Other (See Comments)    unknown  . Penicillins Rash   Patient Measurements: Height: 6\' 1"  (185.4 cm) Weight: 204 lb (92.534 kg) IBW/kg (Calculated) : 79.9  Vital Signs: Temp: 98 F (36.7 C) (04/21 0543) Temp src: Oral (04/21 0543) BP: 150/58 mmHg (04/21 0543) Pulse Rate: 48 (04/21 0543)  Labs:  Recent Labs  07/31/13 1055  07/31/13 1100 07/31/13 1712 07/31/13 2230 08/01/13 0635 08/01/13 1028 08/02/13 0425 08/03/13 0510  HGB 11.6*  --  12.2*  --   --   --   --   --  12.0*  HCT 34.0*  --  36.9*  --   --   --   --   --  36.2*  PLT  --   --  140*  --   --   --   --   --  127*  APTT  --   --  42*  --   --   --   --   --   --   LABPROT  --   < > 27.8*  --   --  34.0*  --  33.4* 28.5*  INR  --   < > 2.71*  --   --  3.52*  --  3.44* 2.80*  CREATININE 1.20  --  1.11  --   --   --  1.00  --   --   TROPONINI  --   --   --  <0.30 <0.30 <0.30  --   --   --   < > = values in this interval not displayed. Estimated Creatinine Clearance: 67.7 ml/min (by C-G formula based on Cr of 1).  Medications:  Prescriptions prior to admission  Medication Sig Dispense Refill  . amiodarone (PACERONE) 200 MG tablet Take 200 mg by mouth 2 (two) times daily.      08/05/13 amLODipine (NORVASC) 10 MG tablet Take 10 mg by mouth every morning.      Marland Kitchen atorvastatin (LIPITOR) 20 MG tablet Take 20 mg by mouth every evening.      . Calcium Citrate-Vitamin D (CITRACAL + D PO) Take 1 tablet by mouth 2 (two) times daily.      . celecoxib (CELEBREX) 50 MG capsule Take 50 mg by mouth 3 (three) times a week. Monday, Wednesday and Thursday      . cyanocobalamin 500 MCG tablet Take 500 mcg by mouth every evening.      . DONEPEZIL HCL PO Take 8 mg by mouth every evening.      . Dutasteride-Tamsulosin HCl (JALYN)  0.5-0.4 MG CAPS Take 1 tablet by mouth every evening.      . folic acid (FOLVITE) 1 MG tablet Take 1 mg by mouth every morning.      . gabapentin (NEURONTIN) 100 MG capsule Take 100-200 mg by mouth 2 (two) times daily. 1 capsule in the morning and two capsules in the evening      . HYDROCODONE-ACETAMINOPHEN PO Take 1 tablet by mouth every 6 (six) hours as needed (pain). 2/25      . Hydrocodone-Homatropine (HYCODAN PO) Take 5 mLs by mouth every 6 (six) hours as needed (cough).      3/25 levothyroxine (SYNTHROID, LEVOTHROID) 175 MCG tablet Take 175 mcg by mouth daily before breakfast.      .  loratadine (CLARITIN) 10 MG tablet Take 10 mg by mouth at bedtime.      Marland Kitchen losartan (COZAAR) 100 MG tablet Take 100 mg by mouth every morning.      . montelukast (SINGULAIR) 10 MG tablet Take 10 mg by mouth at bedtime.      . thiamine 100 MG tablet Take 100 mg by mouth every evening.      . traZODone (DESYREL) 50 MG tablet Take 200 mg by mouth at bedtime.      Marland Kitchen warfarin (COUMADIN) 1 MG tablet Take 1 mg by mouth every evening. Takes along with the 2.5mg  to equal 3.5mg       . warfarin (COUMADIN) 5 MG tablet Take 2.5 mg by mouth every evening. Takes along with a 1mg  tablet to equal 3.5mg       . Xylometazoline HCl (4-WAY NASAL SPRAY NA) Place 1 spray into both nostrils 4 (four) times daily as needed (congestion).      zolpidem (AMBIEN) 5 MG tablet Take 5 mg by mouth at bedtime.       Assessment: 80 yo male admitted with stroke like symptoms.  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  We have been asked to monitor his warfarin therapy while he is hospitalized.  Today his INR has decreased to within the therapeutic range after holding Warfarin 4/19-4/20.  Note a potential plan to convert to Xarelto but it appears this may be done as an outpatient.   HOME:  Total dose is 3.5mg  daily of Warfarin  Goal of Therapy:  INR 2-3 Monitor  platelets by anticoagulation protocol: Yes   Plan:  Coumadin 4mg  today Daily PT/INR  12-16-1974, Pharm.D., BCPS, AAHIVP Clinical Pharmacist Phone: [phone] Pager: [phone] 08/03/2013, 8:35 AM

## 2013-08-03 NOTE — Discharge Summary (Signed)
Physician Discharge Summary  Charles Berg:[id number] DOB: July 17, 1931 DOA: 07/31/2013  PCP: Charles Berg  Admit date: 07/31/2013 Discharge date: 08/03/2013  Time spent: 35 minutes  Recommendations for Outpatient Follow-up:  1. Follow up with PCP in 1-2 weeks 2. Follow up with primary cardiologist at [facility] in 2-4 weeks 3. Repeat head CT in 3-6 months to follow up on 49mm intra-cranial aneurysm 4. Follow up INR within one week  Discharge Diagnoses:  Active Problems:   Stroke   HTN (hypertension)   HLD (hyperlipidemia)   Rheumatoid arthritis   Unspecified hypothyroidism   Hiatal hernia   Personal history of colonic polyps   CAD (coronary artery disease), native coronary artery   Paroxysmal atrial fibrillation   Insomnia   Sick sinus syndrome   Aneurysm of unspecified site   Discharge Condition: Stable  Diet recommendation: Heart healthy with thin liquids  Filed Weights   07/31/13 1041  Weight: 92.534 kg (204 lb)    History of present illness:  Charles Berg is a 80 y.o. male WM PMHx symptomatic bradycardia with pacer placement 2012/remove 2 weeks later secondary to infection, S/P subcutaneous loop recorder. States since 2012 has been on rate control medication amiodarone 200 mg BID, without side effect. On further questioning the daughter and wife admit that on numerous occasions patient has had presyncope and had to sit down, and has had one recent occurrence of syncope. Review of care everywhere; shows patient was last seen by Dr. Gerre Pebbles electrophysiology [facility] January 2010 at which time he was believed to be suffering from sick sinus syndrome. At that time patient's Cardizem and Toprol were discontinued and patient was continued on amiodarone for rhythm control. Was felt at that time patient may need a pacemaker instead of atrial fibrillation ablation.  Presented to the emergency department with acute onset of difficulty speaking abnormal  gait while cooking breakfast this morning. The patient, states, that he does not feel, weakness, in his extremities, but and had trouble walking and speaking. Patient, states, that the chest pain, shortness of breath, nausea, vomiting, diarrhea, headache, blurred vision, dizziness, fever, abdominal pain, or syncope. The patient states, that he has not had a stroke in the past. The patient does have some confusion as well. Patient wife provided a list showing that patient S/P cardioversion 08/05/2007 and 01/30/2008, S/P CardioNet monitor January 2010 and 06/08/2010   Hospital Course:  Sick sinus syndrome?  - Held amiodarone per Cardiology recs - No indication for  - 4/19 carotid doppler and 2d echo done, both unremarkable  - HR has since remained stable - Cardiology recs for patient to follow up with primary Cardiologist on discharge Paroxysmal atrial fibrillation  -Patient currently in sinus bradycardia.  -Continue Coumadin per pharmacy  -Supertherapeutic today  HTN  -Continue amlodipine 10 mg  -Continue losartan 100 mg  -Hydralazine IV when necessary SBP>160 or DBP> 100  Hypothyroidism  -TSH; within normal limits  -continue Synthroid 175 mcg daily  -free T4 w/in normal limits  Coronary artery disease  -Obtain troponin x3 (negative)  -Obtain proBNP mildly elevated at 357  -Echocardiogram unremarkable. No WMA  HLD  -lipid panel; within AHA guideline  Rheumatoid arthritis  -Continue patient's Percocet 5/325 mg Q6 hr PRN pain  -Held gabapentin as patient and wife unsure why she is on this medication  Insomnia  -Continued trazodone 200 mg QHS  Aneurysm  -Patient has newly diagnosed aneurysm 3 mm; considered low risk for rupture will need reimaging in 6  months.   Consultations:  Neurology  Cardiology  Discharge Exam: Filed Vitals:   08/02/13 1737 08/02/13 1938 08/03/13 0543 08/03/13 0941  BP: 140/45 142/52 150/58 140/66  Pulse: 45 46 48 52  Temp: 97.9 F (36.6 C) 98 F (36.7  C) 98 F (36.7 C) 97.2 F (36.2 C)  TempSrc: Oral Oral Oral Oral  Resp: 18 18 18 20   Height:      Weight:      SpO2: 97% 100% 99% 99%    General: Awake, in nad Cardiovascular: regular, s1, s2 Respiratory: normal resp effort, no wheezing  Discharge Instructions     Medication List    STOP taking these medications       amiodarone 200 MG tablet  Commonly known as:  PACERONE     gabapentin 100 MG capsule  Commonly known as:  NEURONTIN      TAKE these medications       4-WAY NASAL SPRAY NA  Place 1 spray into both nostrils 4 (four) times daily as needed (congestion).     amLODipine 10 MG tablet  Commonly known as:  NORVASC  Take 10 mg by mouth every morning.     atorvastatin 20 MG tablet  Commonly known as:  LIPITOR  Take 20 mg by mouth every evening.     celecoxib 50 MG capsule  Commonly known as:  CELEBREX  Take 50 mg by mouth 3 (three) times a week. Monday, Wednesday and Thursday     CITRACAL + D PO  Take 1 tablet by mouth 2 (two) times daily.     cyanocobalamin 500 MCG tablet  Take 500 mcg by mouth every evening.     DONEPEZIL HCL PO  Take 8 mg by mouth every evening.     folic acid 1 MG tablet  Commonly known as:  FOLVITE  Take 1 mg by mouth every morning.     HYCODAN PO  Take 5 mLs by mouth every 6 (six) hours as needed (cough).     HYDROCODONE-ACETAMINOPHEN PO  Take 1 tablet by mouth every 6 (six) hours as needed (pain). 2/25     JALYN 0.5-0.4 MG Caps  Generic drug:  Dutasteride-Tamsulosin HCl  Take 1 tablet by mouth every evening.     levothyroxine 175 MCG tablet  Commonly known as:  SYNTHROID, LEVOTHROID  Take 175 mcg by mouth daily before breakfast.     loratadine 10 MG tablet  Commonly known as:  CLARITIN  Take 10 mg by mouth at bedtime.     losartan 100 MG tablet  Commonly known as:  COZAAR  Take 100 mg by mouth every morning.     montelukast 10 MG tablet  Commonly known as:  SINGULAIR  Take 10 mg by mouth at bedtime.      thiamine 100 MG tablet  Take 100 mg by mouth every evening.     traZODone 50 MG tablet  Commonly known as:  DESYREL  Take 200 mg by mouth at bedtime.     warfarin 5 MG tablet  Commonly known as:  COUMADIN  Take 2.5 mg by mouth every evening. Takes along with a 1mg  tablet to equal 3.5mg      warfarin 1 MG tablet  Commonly known as:  COUMADIN  Take 1 mg by mouth every evening. Takes along with the 2.5mg  to equal 3.5mg      zolpidem 5 MG tablet  Commonly known as:  AMBIEN  Take 5 mg by mouth at bedtime.  Allergies  Allergen Reactions  . Aspirin Other (See Comments)    Yeast infection in mouth  . Sulfa Antibiotics Other (See Comments)    unknown  . Penicillins Rash   Follow-up Information   Follow up with Gates Rigg, MD. Schedule an appointment as soon as possible for a visit in 2 months. (Stroke Clinic)    Specialties:  Neurology, Radiology   Contact information:   [address] [place] [address] [phone]       Follow up with Graciela Husbands, Doristine Church. Schedule an appointment as soon as possible for a visit in 1 week.   Specialty:  Internal Medicine   Contact information:   [facility] [address] [place] [address] [phone]       Call Follow up with your Cardiologist in 2-4 weeks.       The results of significant diagnostics from this hospitalization (including imaging, microbiology, ancillary and laboratory) are listed below for reference.    Significant Diagnostic Studies: Dg Chest 2 View  07/31/2013   CLINICAL DATA:  Stroke.  EXAM: CHEST  2 VIEW  COMPARISON:  None.  FINDINGS: Mild hyperinflation. Lateral view degraded by patient arm position. Borderline loss of thoracic vertebral body height at multiple levels. Favored to be within normal variation.  Telemetry device projects over the left side of the mediastinum on the frontal.  Patient rotated right. Midline trachea. Normal heart size and mediastinal contours for age. No  pleural effusion or pneumothorax. Biapical pleural parenchymal scarring. Mild volume loss at the left lung base.  IMPRESSION: Hyperinflation and pleural parenchymal scarring.  No acute findings.   Electronically Signed   By: Jeronimo Greaves M.D.   On: 07/31/2013 15:23   Ct Head Wo Contrast  07/31/2013   CLINICAL DATA:  80 year old male code stroke. Left side facial droop. Initial encounter.  EXAM: CT HEAD WITHOUT CONTRAST  TECHNIQUE: Contiguous axial images were obtained from the base of the skull through the vertex without intravenous contrast.  COMPARISON:  None.  FINDINGS: No acute osseous abnormality identified. Visualized orbits and scalp soft tissues are within normal limits. Visualized paranasal sinuses and mastoids are clear.  Calcified atherosclerosis at the skull base. Cerebral volume is within normal limits for age. No ventriculomegaly. No midline shift, mass effect, or evidence of intracranial mass lesion. No suspicious intracranial vascular hyperdensity. No evidence of cortically based acute infarction identified. Gray-white matter differentiation is within normal limits throughout the brain. No acute intracranial hemorrhage identified.  IMPRESSION: Normal for age non contrast CT appearance of the brain.  Study discussed by telephone with Dr. Ritta Slot on 07/31/2013 at 11:04 .   Electronically Signed   By: Augusto Gamble M.D.   On: 07/31/2013 11:04   Mr Maxine Glenn Head Wo Contrast  07/31/2013   CLINICAL DATA:  80 year old male with slurred speech and facial droop. Difficulty walking. Code stroke.  EXAM: MRI HEAD WITHOUT CONTRAST  MRA HEAD WITHOUT CONTRAST  TECHNIQUE: Multiplanar, multiecho pulse sequences of the brain and surrounding structures were obtained without intravenous contrast. Angiographic images of the head were obtained using MRA technique without contrast.  COMPARISON:  Non contrast head CT 1048 hr the same day.  FINDINGS: MRI HEAD FINDINGS  Cerebral volume is within normal limits for age.  No restricted diffusion to suggest acute infarction. No midline shift, mass effect, evidence of mass lesion, ventriculomegaly, extra-axial collection or acute intracranial hemorrhage. Cervicomedullary junction and pituitary are within normal limits.  Decreased distal left vertebral artery flow  void (series 11, image 14). Other Major intracranial vascular flow voids are preserved.  Small focus of chronic blood products in the right parietal lobe (series 9, image 121) without associated T2 or FLAIR hyperintensity. Elsewhere normal for age gray and white matter throughout the brain.  Negative visualized cervical spine. Normal bone marrow signal. Visualized orbit soft tissues are within normal limits. Minor paranasal sinus mucosal thickening. Mastoids are clear. Visible internal auditory structures appear normal. Visualized scalp soft tissues are within normal limits.  MRA HEAD FINDINGS  Absent antegrade flow in the distal left vertebral artery, instilled just before the vertebrobasilar junction. Preserved antegrade flow in the distal right vertebral artery. Normal right PICA origin.  The basilar artery is patent without stenosis. SCA and PCA origins are normal. Posterior communicating arteries are diminutive or absent. Bilateral PCA branches are within normal limits.  Antegrade flow in both ICA siphons. Tortuous distal cervical left ICA. No ICA stenosis. Normal ophthalmic artery origins. Unusual fenestrated appearance of the left ACA A1 segment. Normal carotid termini. Normal MCA and ACA origins otherwise. Anterior communicating artery remarkable for a 3 mm aneurysm directed inferiorly (series 5, images 89 and 90).  Other visualized ACA branches are within normal limits. Visualized bilateral MCA branches are within normal limits.  IMPRESSION: 1. No acute infarct. 2. 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 Favor chronic in light of no signal changes in the left cerebellum. 3. 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 4. Negative MRI appearance of the brain parenchyma aside from a small focus of chronic hemorrhage in the right parietal lobe. Study discussed by telephone with Dr. Ritta Slot on 07/31/2013 at 15:05 .   Electronically Signed   By: Augusto Gamble M.D.   On: 07/31/2013 15:05   Mr Brain Wo Contrast  07/31/2013   CLINICAL DATA:  80 year old male with slurred speech and facial droop. Difficulty walking. Code stroke.  EXAM: MRI HEAD WITHOUT CONTRAST  MRA HEAD WITHOUT CONTRAST  TECHNIQUE: Multiplanar, multiecho pulse sequences of the brain and surrounding structures were obtained without intravenous contrast. Angiographic images of the head were obtained using MRA technique without contrast.  COMPARISON:  Non contrast head CT 1048 hr the same day.  FINDINGS: MRI HEAD FINDINGS  Cerebral volume is within normal limits for age. No restricted diffusion to suggest acute infarction. No midline shift, mass effect, evidence of mass lesion, ventriculomegaly, extra-axial collection or acute intracranial hemorrhage. Cervicomedullary junction and pituitary are within normal limits.  Decreased distal left vertebral artery flow void (series 11, image 14). Other Major intracranial vascular flow voids are preserved.  Small focus of chronic blood products in the right parietal lobe (series 9, image 121) without associated T2 or FLAIR hyperintensity. Elsewhere normal for age gray and white matter throughout the brain.  Negative visualized cervical spine. Normal bone marrow signal. Visualized orbit soft tissues are within normal limits. Minor paranasal sinus mucosal thickening. Mastoids are clear. Visible internal auditory structures appear normal. Visualized scalp soft tissues are within normal limits.  MRA HEAD FINDINGS  Absent antegrade flow in the distal left vertebral artery, instilled just before the vertebrobasilar junction. Preserved antegrade flow in  the distal right vertebral artery. Normal right PICA origin.  The basilar artery is patent without stenosis. SCA and PCA origins are normal. Posterior communicating arteries are diminutive or absent. Bilateral PCA branches are within normal limits.  Antegrade flow in both ICA siphons. Tortuous distal cervical left  ICA. No ICA stenosis. Normal ophthalmic artery origins. Unusual fenestrated appearance of the left ACA A1 segment. Normal carotid termini. Normal MCA and ACA origins otherwise. Anterior communicating artery remarkable for a 3 mm aneurysm directed inferiorly (series 5, images 89 and 90).  Other visualized ACA branches are within normal limits. Visualized bilateral MCA branches are within normal limits.  IMPRESSION: 1. No acute infarct. 2. 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 Favor chronic in light of no signal changes in the left cerebellum. 3. 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 4. Negative MRI appearance of the brain parenchyma aside from a small focus of chronic hemorrhage in the right parietal lobe. Study discussed by telephone with Dr. Ritta Slot on 07/31/2013 at 15:05 .   Electronically Signed   By: Augusto Gamble M.D.   On: 07/31/2013 15:05    Microbiology: No results found for this or any previous visit (from the past 240 hour(s)).   Labs: Basic Metabolic Panel:  Recent Labs Lab 07/31/13 1055 07/31/13 1100 07/31/13 2010 08/01/13 0635 08/01/13 1028 08/02/13 0425 08/03/13 0510  NA 141 143  --   --  142  --   --   K 3.8 4.0  --   --  4.3  --   --   CL 105 106  --   --  107  --   --   CO2  --  24  --   --  25  --   --   GLUCOSE 81 83  --   --  100*  --   --   BUN 22 24*  --   --  22  --   --   CREATININE 1.20 1.11  --   --  1.00  --   --   CALCIUM  --  9.0  --   --  8.7  --   --   MG  --   --  1.8 1.9 1.9 1.9 1.8   Liver Function Tests:  Recent Labs Lab 07/31/13 1100  AST 21  ALT 26   ALKPHOS 66  BILITOT 0.3  PROT 6.2  ALBUMIN 3.5   No results found for this basename: LIPASE, AMYLASE,  in the last 168 hours No results found for this basename: AMMONIA,  in the last 168 hours CBC:  Recent Labs Lab 07/31/13 1055 07/31/13 1100 08/03/13 0510  WBC  --  4.2 5.0  NEUTROABS  --  3.0  --   HGB 11.6* 12.2* 12.0*  HCT 34.0* 36.9* 36.2*  MCV  --  91.1 89.8  PLT  --  140* 127*   Cardiac Enzymes:  Recent Labs Lab 07/31/13 1712 07/31/13 2230 08/01/13 0635  TROPONINI <0.30 <0.30 <0.30   BNP: BNP (last 3 results)  Recent Labs  07/31/13 2010  PROBNP 357.7   CBG:  Recent Labs Lab 07/31/13 1100 07/31/13 1823 07/31/13 2150 08/03/13 0806 08/03/13 1201  GLUCAP 82 96 103* 96 84    Signed:  Jerald Kief  [facility] 08/03/2013, 1:10 PM

## 2013-08-03 NOTE — Progress Notes (Signed)
Patient being  discharged to home . Discharge instructions given and reviewed with patient and wife . Handouts given for education and reviewed ; Ischemic stroke, stroke prevention,warfarin: What you need to know ,Transient Ischemic Attack, Near Syncope. Patient and wife verbalized understanding of discharge instructions. Shower chair delivered prior to discharge. Continue with plan of care .             Charles Berg

## 2013-08-03 NOTE — Progress Notes (Signed)
Patient discharged to home with wife via wheelchair with all belongings.         Charles Berg

## 2013-08-03 NOTE — Progress Notes (Signed)
Stroke Team Progress Note  HISTORY Charles Berg is a 80 y.o. male with a history of atrial fibrillation on Coumadin who was in his normal state on awakening the morning of 07/31/2013. While making breakfast (waffles) he was speaking with his wife and she noticed that his speech become markedly slurred. She then called 911 out of concern for stroke. Initially 9:30 AM was page as LKW, but this is when she noticed the abnormality; she last saw him normal at 8:40 AM.  tpa was not given as mild symptoms, anticoagulated  SUBJECTIVE Wife at bedside. Pt excited to go home today.  OBJECTIVE Most recent Vital Signs: Filed Vitals:   08/02/13 1737 08/02/13 1938 08/03/13 0543 08/03/13 0941  BP: 140/45 142/52 150/58 140/66  Pulse: 45 46 48 52  Temp: 97.9 F (36.6 C) 98 F (36.7 C) 98 F (36.7 C) 97.2 F (36.2 C)  TempSrc: Oral Oral Oral Oral  Resp: 18 18 18 20   Height:      Weight:      SpO2: 97% 100% 99% 99%   CBG (last 3)   Recent Labs  07/31/13 1823 07/31/13 2150 08/03/13 0806  GLUCAP 96 103* 96    IV Fluid Intake:      MEDICATIONS  . amLODipine  10 mg Oral Q0600  . atorvastatin  20 mg Oral QPM  . celecoxib  100 mg Oral Q M,W,F  . cyanocobalamin  500 mcg Oral QPM  . donepezil  10 mg Oral QHS  . dutasteride  0.5 mg Oral QPM   And  . tamsulosin  0.4 mg Oral QPC supper  . folic acid  1 mg Oral Daily  . levothyroxine  175 mcg Oral QAC breakfast  . loratadine  10 mg Oral QHS  . losartan  100 mg Oral Daily  . montelukast  10 mg Oral QHS  . thiamine  100 mg Oral QPM  . traZODone  200 mg Oral QHS  . warfarin  4 mg Oral ONCE-1800  . Warfarin - Pharmacist Dosing Inpatient   Does not apply q1800  . zolpidem  5 mg Oral QHS   PRN:  hydrALAZINE, oxyCODONE-acetaminophen, senna-docusate  Diet:  Cardiac thin liquids Activity:  OOB DVT Prophylaxis:  Warfarin  CLINICALLY SIGNIFICANT STUDIES Basic Metabolic Panel:  Recent Labs Lab 07/31/13 1100  08/01/13 1028 08/02/13 0425  08/03/13 0510  NA 143  --  142  --   --   K 4.0  --  4.3  --   --   CL 106  --  107  --   --   CO2 24  --  25  --   --   GLUCOSE 83  --  100*  --   --   BUN 24*  --  22  --   --   CREATININE 1.11  --  1.00  --   --   CALCIUM 9.0  --  8.7  --   --   MG  --   < > 1.9 1.9 1.8  < > = values in this interval not displayed. Liver Function Tests:   Recent Labs Lab 07/31/13 1100  AST 21  ALT 26  ALKPHOS 66  BILITOT 0.3  PROT 6.2  ALBUMIN 3.5   CBC:   Recent Labs Lab 07/31/13 1055 07/31/13 1100 08/03/13 0510  WBC  --  4.2 5.0  NEUTROABS  --  3.0  --   HGB 11.6* 12.2* 12.0*  HCT 34.0* 36.9* 36.2*  MCV  --  91.1 89.8  PLT  --  140* 127*   Coagulation:   Recent Labs Lab 07/31/13 1100 08/01/13 0635 08/02/13 0425 08/03/13 0510  LABPROT 27.8* 34.0* 33.4* 28.5*  INR 2.71* 3.52* 3.44* 2.80*   Cardiac Enzymes:   Recent Labs Lab 07/31/13 1712 07/31/13 2230 08/01/13 0635  TROPONINI <0.30 <0.30 <0.30   Urinalysis:   Recent Labs Lab 07/31/13 1229 07/31/13 1911  COLORURINE YELLOW YELLOW  LABSPEC 1.013 1.015  PHURINE 6.5 6.5  GLUCOSEU NEGATIVE NEGATIVE  HGBUR NEGATIVE NEGATIVE  BILIRUBINUR NEGATIVE NEGATIVE  KETONESUR NEGATIVE 15*  PROTEINUR NEGATIVE NEGATIVE  UROBILINOGEN 0.2 0.2  NITRITE NEGATIVE NEGATIVE  LEUKOCYTESUR TRACE* NEGATIVE   Lipid Panel    Component Value Date/Time   CHOL 119 07/31/2013 2010   TRIG 44 07/31/2013 2010   HDL 64 07/31/2013 2010   CHOLHDL 1.9 07/31/2013 2010   VLDL 9 07/31/2013 2010   LDLCALC 46 07/31/2013 2010   HgbA1C  Lab Results  Component Value Date   HGBA1C 5.0 08/01/2013    Urine Drug Screen:     Component Value Date/Time   LABOPIA NONE DETECTED 07/31/2013 1229   COCAINSCRNUR NONE DETECTED 07/31/2013 1229   LABBENZ NONE DETECTED 07/31/2013 1229   AMPHETMU NONE DETECTED 07/31/2013 1229   THCU NONE DETECTED 07/31/2013 1229   LABBARB NONE DETECTED 07/31/2013 1229    Alcohol Level: No results found for this basename: ETH,   in the last 168 hours  Dg Chest 2 View 07/31/2013    Hyperinflation and pleural parenchymal scarring.  No acute findings.    Ct Head Wo Contrast 07/31/2013    Normal for age non contrast CT appearance of the brain.    MRI/A Head Wo Contrast 07/31/2013    1. No acute infarct. 2. 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 Favor chronic in light of no signal changes in the left cerebellum. 3. 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 4. Negative MRI appearance of the brain parenchyma aside from a small focus of chronic hemorrhage in the right parietal lobe.   2D Echocardiogram  Mildly dilated LV with EF 55%. Normal diastolic function. Mild LAE. Mild MR. Normal RV size and systolic function. Mild pulmonary hypertension.  Carotid Doppler  No evidence of hemodynamically significant internal carotid artery stenosis. Vertebral artery flow is antegrade.    EKG  sinus bradycardia rate 51 beats per minute. For complete results please see formal report.   Therapy Recommendations none  Physical Exam   Mental Status:  Patient is awake, alert, oriented to person, place, month, year, and situation.  Immediate and remote memory are intact.  Patient is able to give a clear and coherent history.  No signs of aphasia or neglect  He is dysarthric  Cranial Nerves:  II: Visual Fields are full. Pupils are equal, round, and reactive to light. Discs are difficult to visualize.  III,IV, VI: EOMI without ptosis or diploplia.  V: Facial sensation is decreased on the left to pin  VII: Facial movement is notable for left facial weakness  VIII: hearing is intact to voice  X: Uvula elevates symmetrically  XI: Shoulder shrug is symmetric.  XII: tongue is midline without atrophy or fasciculations.  Motor:  Tone is normal. Bulk is normal. 5/5 strength was present in all four extremities.  Sensory:  Sensation is symmetric to light touch and  temperature in the arms and legs.  Deep Tendon Reflexes:  2+ and symmetric  in the biceps and patellae.  Plantars:  Toes are downgoing bilaterally.  Cerebellar:  FNF and HKS are intact bilaterally  Gait: Not assessed   ASSESSMENT [name] is a 80 y.o. male presenting with dysarthria. TPA was not administered since the patient was on warfarin for atrial fibrillation. INR at time of admission 2.71. CT and MRI showed no acute infarct. Dx:  Right brain TIA. On warfarin prior to admission. Now on warfarin for secondary stroke prevention. Patient with resultant dysarthria. Stroke work up completed.   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  3 mm anterior communicating artery aneurysm -incidental finding  Small focus of chronic hemorrhage in the right parietal lobe.  History of paroxysmal atrial fibrillation  Chronic warfarin therapy - INR 3.52 today. ( Patient also on Celebrex )   SSS - no indication for pacer, seen by cardiology, stopped amiodarone, f/u with Dr. Lady Gary as OP  Hypertension history  Hyperlipidemia history - has been on Lipitor - cholesterol 119; LDL 46  Question early dementia - on Aricept  Bradycardia   Hospital day # 3  TREATMENT/PLAN  Continue warfarin for now. Consider changing to NOAC in the future; patient would like to discuss with cardiology, primary before making the change.  No therapy needs  Ok for discharge  Follow up with Dr. Pearlean Brownie, Stroke Clinic, in 2 months.  Annie Main, MSN, RN, ANVP-BC, ANP-BC, [facility] Pager: [phone] 08/03/2013 10:56 AM  I have personally obtained a history, examined the patient, evaluated imaging results, and formulated the assessment and plan of care. I agree with the above.  Delia Heady, MD  To contact Stroke Continuity provider, please refer to [email or web address]. After hours, contact General Neurology

## 2013-08-05 ENCOUNTER — Encounter: Payer: Self-pay | Admitting: Nurse Practitioner

## 2013-08-12 ENCOUNTER — Ambulatory Visit: Payer: Self-pay | Admitting: Cardiology

## 2013-09-12 ENCOUNTER — Emergency Department: Payer: Self-pay | Admitting: Emergency Medicine

## 2013-09-12 LAB — CBC WITH DIFFERENTIAL/PLATELET
Basophil #: 0 10*3/uL (ref 0.0–0.1)
Basophil %: 0.7 %
Eosinophil #: 0.2 10*3/uL (ref 0.0–0.7)
Eosinophil %: 4.7 %
HCT: 39.3 % — ABNORMAL LOW (ref 40.0–52.0)
HGB: 13 g/dL (ref 13.0–18.0)
Lymphocyte #: 0.6 10*3/uL — ABNORMAL LOW (ref 1.0–3.6)
Lymphocyte %: 12 %
MCH: 29.4 pg (ref 26.0–34.0)
MCHC: 33 g/dL (ref 32.0–36.0)
MCV: 89 fL (ref 80–100)
MONO ABS: 0.5 x10 3/mm (ref 0.2–1.0)
MONOS PCT: 9.6 %
NEUTROS ABS: 3.4 10*3/uL (ref 1.4–6.5)
Neutrophil %: 73 %
PLATELETS: 132 10*3/uL — AB (ref 150–440)
RBC: 4.41 10*6/uL (ref 4.40–5.90)
RDW: 14.8 % — ABNORMAL HIGH (ref 11.5–14.5)
WBC: 4.7 10*3/uL (ref 3.8–10.6)

## 2013-09-12 LAB — PROTIME-INR
INR: 2
Prothrombin Time: 21.9 secs — ABNORMAL HIGH (ref 11.5–14.7)

## 2013-09-12 LAB — BASIC METABOLIC PANEL
Anion Gap: 6 — ABNORMAL LOW (ref 7–16)
BUN: 18 mg/dL (ref 7–18)
CHLORIDE: 105 mmol/L (ref 98–107)
CO2: 28 mmol/L (ref 21–32)
Calcium, Total: 9.2 mg/dL (ref 8.5–10.1)
Creatinine: 1.16 mg/dL (ref 0.60–1.30)
EGFR (African American): >60
EGFR (Non-African Amer.): 59 — ABNORMAL LOW
GLUCOSE: 95 mg/dL (ref 65–99)
Osmolality: 279 (ref 275–301)
Potassium: 3.8 mmol/L (ref 3.5–5.1)
Sodium: 139 mmol/L (ref 136–145)

## 2013-09-12 LAB — TROPONIN I: Troponin-I: <0.02 ng/mL

## 2013-09-21 ENCOUNTER — Ambulatory Visit: Payer: Self-pay | Admitting: Cardiology

## 2013-09-21 LAB — CBC WITH DIFFERENTIAL/PLATELET
Basophil #: 0 10*3/uL (ref 0.0–0.1)
Basophil %: 0.5 %
EOS ABS: 0.1 10*3/uL (ref 0.0–0.7)
Eosinophil %: 2.1 %
HCT: 39.6 % — ABNORMAL LOW (ref 40.0–52.0)
HGB: 12.9 g/dL — ABNORMAL LOW (ref 13.0–18.0)
Lymphocyte #: 0.7 10*3/uL — ABNORMAL LOW (ref 1.0–3.6)
Lymphocyte %: 11.9 %
MCH: 29.5 pg (ref 26.0–34.0)
MCHC: 32.6 g/dL (ref 32.0–36.0)
MCV: 91 fL (ref 80–100)
MONOS PCT: 7.8 %
Monocyte #: 0.5 x10 3/mm (ref 0.2–1.0)
NEUTROS ABS: 4.8 10*3/uL (ref 1.4–6.5)
NEUTROS PCT: 77.7 %
Platelet: 141 10*3/uL — ABNORMAL LOW (ref 150–440)
RBC: 4.38 10*6/uL — ABNORMAL LOW (ref 4.40–5.90)
RDW: 15.3 % — ABNORMAL HIGH (ref 11.5–14.5)
WBC: 6.2 10*3/uL (ref 3.8–10.6)

## 2013-09-21 LAB — URINALYSIS, COMPLETE
BLOOD: NEGATIVE
Bacteria: NONE SEEN
Bilirubin,UR: NEGATIVE
GLUCOSE, UR: NEGATIVE mg/dL (ref 0–75)
KETONE: NEGATIVE
Leukocyte Esterase: NEGATIVE
NITRITE: NEGATIVE
Ph: 5 (ref 4.5–8.0)
Protein: NEGATIVE
RBC,UR: 1 /HPF (ref 0–5)
SPECIFIC GRAVITY: 1.024 (ref 1.003–1.030)

## 2013-09-21 LAB — APTT: ACTIVATED PTT: 39.5 s — AB (ref 23.6–35.9)

## 2013-09-21 LAB — BASIC METABOLIC PANEL
Anion Gap: 2 — ABNORMAL LOW (ref 7–16)
BUN: 28 mg/dL — ABNORMAL HIGH (ref 7–18)
CHLORIDE: 109 mmol/L — AB (ref 98–107)
Calcium, Total: 9.4 mg/dL (ref 8.5–10.1)
Co2: 30 mmol/L (ref 21–32)
Creatinine: 1.28 mg/dL (ref 0.60–1.30)
EGFR (African American): >60
EGFR (Non-African Amer.): 53 — ABNORMAL LOW
Glucose: 92 mg/dL (ref 65–99)
Osmolality: 286 (ref 275–301)
Potassium: 4 mmol/L (ref 3.5–5.1)
Sodium: 141 mmol/L (ref 136–145)

## 2013-09-21 LAB — PROTIME-INR
INR: 1.8
PROTHROMBIN TIME: 20.1 s — AB (ref 11.5–14.7)

## 2013-09-24 ENCOUNTER — Ambulatory Visit: Payer: Self-pay | Admitting: Cardiology

## 2013-09-24 LAB — CBC WITH DIFFERENTIAL/PLATELET
BASOS PCT: 0.8 %
Basophil #: 0 10*3/uL (ref 0.0–0.1)
Eosinophil #: 0.1 10*3/uL (ref 0.0–0.7)
Eosinophil %: 3.5 %
HCT: 41 % (ref 40.0–52.0)
HGB: 13.4 g/dL (ref 13.0–18.0)
LYMPHS ABS: 0.8 10*3/uL — AB (ref 1.0–3.6)
LYMPHS PCT: 21.5 %
MCH: 29.5 pg (ref 26.0–34.0)
MCHC: 32.7 g/dL (ref 32.0–36.0)
MCV: 90 fL (ref 80–100)
MONO ABS: 0.3 x10 3/mm (ref 0.2–1.0)
Monocyte %: 9.3 %
NEUTROS ABS: 2.3 10*3/uL (ref 1.4–6.5)
Neutrophil %: 64.9 %
Platelet: 114 10*3/uL — ABNORMAL LOW (ref 150–440)
RBC: 4.55 10*6/uL (ref 4.40–5.90)
RDW: 15.5 % — ABNORMAL HIGH (ref 11.5–14.5)
WBC: 3.6 10*3/uL — AB (ref 3.8–10.6)

## 2013-09-24 LAB — PROTIME-INR
INR: 1.3
PROTHROMBIN TIME: 16.4 s — AB (ref 11.5–14.7)

## 2013-11-11 ENCOUNTER — Ambulatory Visit: Payer: Self-pay | Admitting: Nurse Practitioner

## 2014-08-06 NOTE — Op Note (Signed)
PATIENT NAME:  Charles Berg, Charles Berg MR#:  [id number] DATE OF BIRTH:  August 03, 1931  DATE OF PROCEDURE:  09/24/2013  TITLE OF PROCEDURE NOTE:    1.  Implantation of a dual-chamber pacemaker.  2.  Removal of implantable loop monitor.  INDICATIONS:  1.  Sick sinus syndrome.  2.  Paroxysmal atrial fibrillation.  3.  Symptomatic bradycardia.   DETAILS OF PROCEDURE:  The patient was brought to the operating room in a fasting, nonsedated state. During his most recent clinic visit last week, informed consent was obtained and placed in the patient's permanent medical record. Once the patient was brought to the operating room, the appropriate resuscitative equipment was attached. The patient was prepped in the usual sterile manner. The area of the right infraclavicular fossa was prepped with ChloraPrep and chlorhexidine.   The area in the left parasternal region was prepped as well for loop removal. First, attention was turned to the loop explantation.  1% Xylocaine was administered to the area where the loop had been implanted. A 1 cm incision was made over the old loop incision site. Using a combination of electrocautery and blunt dissection, the loop was removed from the pocket. The pocket was then irrigated with gentamicin and subsequently adequate hemostasis was obtained. The incision was closed with running layers of 2-0 and 4-0 Vicryl. Steri-Strips were applied over the incision site, as well as a sterile gauze. Attention was then turned to implantation of a new dual-chamber pacemaker in the right infraclavicular fossa. Using 1% Xylocaine, the area of the right infraclavicular fossa was anesthetized. Using a scalpel, a 2 cm incision was made in the right infraclavicular fossa. Using a combination of electrocautery and blunt dissection, a pacemaker pocket was fashioned in the usual manner just above the pectoralis muscle. Using fluoroscopic guidance, access was obtained to the central circulation x 1, and using  the retained wire technique, 2 wires were ultimately inserted through the axillary vein into the area of the inferior vena cava. Over the first guidewire, a sheath was advanced and the guidewire and dilator were subsequently removed. The second guidewire was clamped to the drape. The right ventricular lead was delivered through the sheath to the right ventricular outflow tract, and subsequently withdrawn to the right ventricular apex. It was secured in place there using an 0 silk suture. Testing of the leads showed the following numbers: R waves were 5.9 mV, slew rate of 2.1 v/sec, threshold of 0.7 v at 0.5 ms, impedance of 428 ohms. Adequate redundancy was confirmed and the lead was secured to the prepectoralis fascia using an 0 silk suture. Attention was then turned to the second guidewire over which a 7 French sheath was placed. The dilator and guidewire were subsequently removed. The right atrial lead was taken to the right atrial appendage. Sensed P waves there were 1.9 mV with a slew rate of 0.5 v/sec, pacing threshold of 0.7 v at 0.5 ms and a lead impedance of 520 ohms. Both leads were tested and there was no evidence of diaphragmatic pacing at 10 v. The right atrial lead was then secured to the prepectoralis fascia using an 0 silk suture. Adequate redundancy was confirmed. There was extensive bleeding at the insertion site of the leads. After holding pressure and performing several sutures to the muscle layer around the needle insertion site, hemostasis was obtained. Also added Arista to the pocket to promote more adequate hemostasis. The pocket was irrigated with copious amounts of gentamicin solution. The right atrial and right  ventricular leads were connected to the pacemaker generator and RV pacing and subsequently dual-chamber pacing was noted at 60 beats per minute. The pacemaker was placed in the pocket and the pocket was closed with running layers of 2-0 and 3-0 Vicryl and the subcuticular layer with  4-0 Vicryl. Steri-Strips were applied over the incision site, as well as a sterile gauze and OpSite.   FLUOROSCOPY TIME:  6 minutes and 46 seconds.   ESTIMATED BLOOD LOSS: 30 mL.   There were no complications noted at the conclusion of the procedure.   SUMMARY OF IMPLANTED HARDWARE: The patient received a Medtronic dual-chamber pacemaker that was MRI compatible. The Advisa DR MRI SureScan, serial number [id number], model number [id number].  The right atrial lead was a Medtronic [id number], serial number [id number]. The Medtronic RV lead was a [id number], serial number [id number]. Both leads are MRI or MR conditional. All these were implanted on 09/24/2013.   FINAL PROGRAM PARAMETERS: The device was program mode AAIR DDDR, lower rate limit of 60, upper tracking rate 130, upper sensory 130, paced AV delay 180, sensed AV delay 150. Outputs were 3.5 v at 0.4 ms.      ____________________________ Anna Genre. Maisie Fus, MD klt:dmm D: 09/24/2013 18:02:32 ET T: 09/24/2013 22:18:19 ET JOB#: [id number]  cc: Caryn Bee L. Maisie Fus, MD, <Dictator> Sharion Settler MD ELECTRONICALLY SIGNED 10/29/2013 10:14

## 2014-08-07 NOTE — H&P (Signed)
PATIENT NAME:  Charles Berg, MIERZWA MR#:  [id number] DATE OF BIRTH:  27-Mar-1932  DATE OF ADMISSION:  04/18/2011  REFERRING PHYSICIAN: Dr. Enedina Finner    PRIMARY CARE PHYSICIAN: Daniel Nones, MD   REASON FOR ADMISSION: Recurrent falls, syncope.   HISTORY OF PRESENT ILLNESS: This is a 81 year old white male with past medical history significant for sick sinus syndrome status post pacemaker, hypertension, hyperlipidemia, and atrial fibrillation on Coumadin who presents with ongoing syncopal episodes and dizziness, unsteady gait over the last month and a half. He said on 12/03 he fell while in the bank line and passed out. He said yesterday he fell twice and just kind of had a dizzy feeling. He said he has been having this ongoing. He saw Dr. Lady Gary recently and has seen Dr. Graciela Husbands regarding this. He had recent Holter monitor which I cannot seem to get records for. He said that he does not have any chest pain, shortness of breath, nausea, vomiting, diarrhea, fevers, chills, or shakes. He says it's been ongoing. He has not been driving. He said his wife has been driving for him. We are asked to admit the patient for recurrent syncopal episodes.   PAST MEDICAL HISTORY: 1. Inflammatory arthritis. 2. Rheumatoid arthritis versus PMR.  3. Osteopenia. 4. Paroxysmal atrial fibrillation. 5. Hypothyroidism. 6. Hyperlipidemia. 7. Hiatal hernia.  8. Peptic ulcer disease.  9. Constipation. 10. Colonic polyps. 11. Benign prostatic hypertrophy. 12. Coronary artery disease seen on cath 01/2005. 13. Diverticulosis.  14. Anemia.  15. Thrombocytopenia.   PAST SURGICAL HISTORY:  1. Status post pacemaker. 2. Status post pacemaker retraction. 3. Penile prosthesis.   MEDICATIONS:  1. AcipHex 20 mg twice daily.  2. Xanax 0.25 mg b.i.d.  3. Claritin 10 mg daily.  4. Folic Acid 1 mg daily.  5. Levothyroxine 175 mcg daily.  6. Singulair 10 mg daily.  7. Trazodone 100 mg 2 tablets at bedtime. 8. Uroxatral 10 mg by mouth  daily.  9. Vitamin B12 500 mg at bedtime. 10. Atorvastatin 10 mg daily.  11. Amiodarone 200 mg twice daily.  12. Avodart 0.5 mg by mouth daily.  13. Celebrex 200 mg every morning. 14. Losartan 25 mg daily. 15. Warfarin 5 mg daily.   ALLERGIES: Aspirin, penicillin, and sulfa.   FAMILY HISTORY: His father died of MI and cancer at age 69. Mother lived to 41, died just this past November 26, 2022.   SOCIAL HISTORY: He is married. He used to be a smoker 1 pack a day, five cigars. He drinks occasionally. He does not smoke anymore. He quit about 37 years ago. Used to work for the [employer] and [employer]. Has three healthy children.    REVIEW OF SYSTEMS: CONSTITUTIONAL: No fever but he has fatigue. No weakness. EYES: No blurred vision, double vision, pain, redness, inflammation, glaucoma. ENT: No tinnitus, ear pain, hearing loss, seasonal allergies, epistaxis, discharge. RESPIRATORY: No cough, wheezing, hemoptysis, dizziness, painful respirations. CARDIOVASCULAR: No chest pain, orthopnea, edema, arrhythmias, dyspnea on exertion, palpitations. He does have syncope. GI: No nausea, vomiting, diarrhea, abdominal pain, melena, gastroesophageal reflux disease. GU: No dysuria, hematuria, renal calculi, frequency, incontinence. GU Male. No sores, discharge, prostatitis. ENDOCRINE: No polyuria, nocturia, thyroid problems, increased sweating, heat or cold intolerance. HEME/LYMPH: No anemia, bruising, swollen glands. INTEGUMENTARY: No acne, rash, lesions, change in mole, hair or skin. MUSCULOSKELETAL: No pain in back, shoulder, knee, hip, arthritis, swelling, gout. NEUROLOGIC: No numbness, weakness, epilepsy, tremor, vertigo, ataxia. PSYCH: No anxiety, insomnia, ADD, bipolar, depression.   PHYSICAL EXAMINATION:  VITAL SIGNS: Temperature 98.1, heart rate 47, blood pressure 191/88, sating 97% on room air, respiratory rate 18.  GENERAL: The patient is well developed, well nourished in no apparent distress, alert and  oriented x3.   HEENT: Pupils equal, reactive to light and accommodation. Extraocular muscles intact. Anicteric sclerae. No difficulty hearing. Oropharynx clear.   NECK: No JVD. No thyromegaly. No lymphadenopathy. No carotid bruits.   LUNGS: Clear to auscultation. No adventitious breath sounds. No use of accessory muscles.   CARDIOVASCULAR: Regular rate and rhythm. Normal sinus rhythm. No murmurs, gallops, or rubs appreciated. No lower extremity edema. Currently he is not in atrial fibrillation. 2+ dorsalis pedis pulses.   BREASTS: No obvious masses.   ABDOMEN: Soft, nontender, nondistended. Positive bowel sounds.   GU: Deferred.   MUSCULOSKELETAL: Strength 5/5. No clubbing, cyanosis, or degenerative joint disease.   SKIN: No rashes, lesions, or induration. Warm to touch.   LYMPH: No lymphadenopathy in the cervical or supraclavicular regions.  NEUROLOGIC: Cranial nerves II through XII are intact. +2 reflexes. Strength 5 out of 5. Steady gait. Visual fields are intact. He is able to do finger-to-nose test.   PSYCH: Alert and oriented x3.   LABORATORY, DIAGNOSTIC, AND RADIOLOGICAL DATA: Glucose 95, BUN 25, creatinine 1.21, sodium 143, potassium 4.1, chloride 106, bicarb 29, anion gap 8, total protein 7.0, albumin 4.0, total bilirubin 0.4, alkaline phosphatase 53, AST 23, ALT 26. CK 133. MB 2.1. Troponin less than 0.02. TSH 3.49. WBC 4.6, hemoglobin 13.5, hematocrit 39.6, platelets 127, MCV 91.   Head CT shows no acute intracranial process. Chest x-ray shows no acute disease of the chest. EKG shows sinus bradycardia. No ST changes.   ASSESSMENT AND PLAN: This is a 81 year old white male with sick sinus syndrome, atrial fibrillation, hypertension, and peptic ulcer disease who presents with falls, dizziness, and syncope over the last month.  1. Syncope, falls. The patient had recent Holter monitor and seen by Dr. Lady Gary. Will consult Dr. Lady Gary. TSH is within normal limits. Will get cardiac  enzymes x3. Monitor on telemetry. The patient will get echo, carotids, and MRI as he has not had these recently. May need EEG as seizure could be in the form of syncope.  2. Malignant hypertension. Will resume losartan and give p.r.n. hydralazine and reassess.  3. Sick sinus syndrome, ventricular fibrillation. Will continue Coumadin, amiodarone, and pacemaker.  4. Hyperlipidemia. Will continue statin.  5. Thrombocytopenia. This seems to be chronic.  6. DVT prophylaxis. Maintain with Coumadin.   CODE STATUS: FULL CODE.   TOTAL TIME SPENT ON ADMISSION: 55 minutes. I discussed the patient with Dr. Graciela Husbands.   Thank you for allowing me to participate in the care of this patient. We will sign out to [facility] later today.  ____________________________ Corie Chiquito. Lafayette Dragon, MD aaf:drc D: 04/19/2011 11:51:20 ET T: 04/19/2011 12:24:33 ET JOB#: [id number] cc: Karolee Ohs A. Lafayette Dragon, MD, <Dictator>, Lynnea Ferrier, MD Coralyn Pear MD ELECTRONICALLY SIGNED 04/23/2011 14:42

## 2014-08-07 NOTE — Consult Note (Signed)
General Aspect patient is a 81 year old male with history of atrial fibrillation as well as sick sinus syndrome. He had temporarily had a permanent pacemaker placed in the past do to sick sinus syndrome. This was complicated with site infection in the device was removed. Since that time he has had sinus bradycardia with no documented pauses or severe bradycardia. He is remains significantly bradycardic with heart rates in the 40s to 50s however has been hemodynamically stable with this. He is had a number of syncopal episodes since that time. He has been evaluated with Holter monitors and event monitors with no evidence of significant pauses or bradycardia. Patient had several falls prior to this admission which he described as different than the symptoms he had previously. He was very unsteady on his feet but denied lightheadedness or dizziness. He was brought to the emergency room where he was in sinus bradycardia. He is ruled out for myocardial infarction. He currently is hemodynamically stable.   Physical Exam:   GEN WD, WN, NAD    HEENT PERRL    NECK No masses    RESP clear BS    CARD Regular rate and rhythm  Bradycardic  Murmur    Murmur Systolic    Systolic Murmur axilla    ABD denies tenderness  normal BS    LYMPH negative neck    EXTR negative cyanosis/clubbing, negative edema    SKIN normal to palpation    NEURO cranial nerves intact, motor/sensory function intact    PSYCH A+O to time, place, person   Review of Systems:   Subjective/Chief Complaint instability on his feet with probable syncope    General: Weakness    Skin: No Complaints    ENT: No Complaints    Eyes: No Complaints    Neck: No Complaints    Respiratory: No Complaints    Cardiovascular: No Complaints    Gastrointestinal: No Complaints    Genitourinary: No Complaints    Vascular: No Complaints    Musculoskeletal: No Complaints    Neurologic: Dizzness  Fainting    Hematologic: No  Complaints    Endocrine: No Complaints    Psychiatric: No Complaints    Review of Systems: All other systems were reviewed and found to be negative    Medications/Allergies Reviewed Medications/Allergies reviewed     CHF:    Hypothyroidism:    Hypercholesterolemia:    HTN:    CAD:    penile implant:    pacemaker insertion: Aug 2011   Cardiac Surgery: CATH  Home Medications:  folic acid 1 mg oral tablet: take 1 tablet by  mouth every day , Active  Zocor 20 mg oral tablet: take 1 tablet by mouth every day at bedtime, Active  Uroxatral 10 mg oral tablet, extended release: take 1 tablet by mouth every day , Active  Claritin 10 mg oral tablet: take 1 tablet by mouth every day (AM), Active  Celebrex 200 mg oral capsule: take 1 cap every day (AM), Active  Levothroid 175 mcg (0.175 mg) oral tablet: 1  orally once a day , Active  Singulair 10 mg oral tablet: 1  orally  HS, Active  multag: takes 400mg  2 x daily AM and PM, Active  alprazolam 0.25 mg oral tablet: 1  orally 2 times a day , Active  Vitamin B-12:   once a day , Active  atorvastatin 10 mg oral tablet: 1  orally once a day (at bedtime) , Active  trazodone 100 mg oral  tablet: 2 cap(s) orally once a day (at bedtime) , Active  Coumadin 5 mg oral tablet: 1  orally once a day (at bedtime) , Active  amiodarone 200 mg oral tablet: 1  orally 2 times a day , Active  Jalyn 0.5 mg-0.4 mg oral capsule: 1 cap(s) orally once a day, Active  EKG:   Interpretation sinus bradycardia    ASA: GI Distress  Sulfa drugs: Other  Penicillin: Rash    Impression 81 year old male with history of atrial fibrillation and sick sinus syndrome currently in sinus bradycardia on dronederone. He has had syncopal episodes in the past. He did have a permanent pacemaker placed in the past which had to be removed secondary to site infection. Since that time he has had several syncopal episodes an extensive noninvasive workup has not revealed etiology of  this. He has had Holter monitors and event monitors which have not revealed any significant pauses or severe bradycardia. His resting heart rate remains in the high 40s to low 50s. He has been hemodynamically stable with this. Syncopal episodes are prompted this admission he states were different than the previous ones. He did not have significant dizziness this time but felt very unsteady on his feet and very unbalanced. Carotid Dopplers did not reveal any significant abnormalities. He has been evaluated by ENT in the past. His electrocardiogram today is similar to his baseline. He is ruled out for myocardial infarction denies chest pain.    Plan 1. Will monitor on telemetry for evidence of significant bradycardia or other tachycardia arrhythmias 2. Carotid Dopplers have not shown significant abnormalities 3. Echocardiogram is pending and will evaluate this for evidence of structural or valvular etiology of his symptoms 4. Agree with consideration for brain MRI to evaluate for neurologic etiology of symptoms 5. Further recommendations based on the patient's course and the results of the aforementioned studies   Electronic Signatures: Dalia Heading (MD)  (Signed 05-Jan-13 05:30)  Authored: General Aspect/Present Illness, History and Physical Exam, Review of System, Past Medical History, Home Medications, EKG , Allergies, Impression/Plan   Last Updated: 05-Jan-13 05:30 by Dalia Heading (MD)

## 2014-08-07 NOTE — Discharge Summary (Signed)
PATIENT NAME:  Charles Berg, Charles Berg MR#:  [id number] DATE OF BIRTH:  [birth year]  DATE OF ADMISSION:  04/21/2011 DATE OF DISCHARGE:  04/22/2011  FINAL DIAGNOSES:  1. Syncope likely secondary to bradycardia and orthostatic hypotension.  2. History of sick sinus syndrome, atrial fibrillation.  3. Malignant hypertension.  4. Hyperlipidemia.  5. Inflammatory arthritis.  6. Osteopenia.  7. Hypothyroidism.  8. Hyperlipidemia.  9. Gastroesophageal reflux disease.   HISTORY AND PHYSICAL: Please see dictated admission history and physical.   SUMMARY OF HOSPITAL COURSE: The patient was admitted after an episode of syncope. This was transient and he did have some warning. Cardiac enzymes were followed which were negative. He was watched on the monitor and was found to have sinus bradycardia with a low heart rate of 44 with no symptoms with this. He underwent carotid Doppler which was reportedly negative for flow limiting disease. MRI revealed small vessel disease without acute changes. Echocardiogram revealed preserved LV function with some increase in right heart pressures. Cardiology saw the patient in consultation and it is recommended that he undergo implantation of a REVEAL monitor and plan is for him to follow-up in our office on Friday to have this device placed to see whether heart rate is playing a role in his symptoms and whether he needs a replacement of a pacemaker. He is comfortable with this plan. His blood pressure required adjustment of medications but became reasonably controlled. He was noted to have some mild orthostasis, again unclear that this was contributing to all of his symptoms and it may be a combination of orthostasis and bradycardia which led to his event.   At this time he feels comfortable going home and will be discharged in stable condition. Because he does get a warning that symptoms are to occur, he knows that he needs to sit down as this has worked well. He is able to drive as  long as he remembers to move to the side immediately if he begins having symptoms and it may be safer to have someone else drive him and he is comfortable with this as well.   DIET: He will follow a 2 gram sodium diet.   PHYSICAL ACTIVITY: To be up as tolerated.   FOLLOW-UP: He will follow-up with Dr. Lady Gary on Friday.   DISCHARGE MEDICATIONS:  1. Folate 1 mg p.o. daily.  2. Uroxatral 10 mg p.o. daily.  3. Claritin 10 mg p.o. daily.  4. Levothyroxine 0.175 mg p.o. daily.  5. Xanax 0.25 mg p.o. b.i.d.  6. Vitamin B12 500 mcg p.o. daily.  7. Lipitor 10 mg p.o. at bedtime.  8. Trazodone 200 mg p.o. at bedtime.  9. Coumadin 5 mg p.o. at bedtime.  10. Amiodarone 200 mg p.o. b.i.d.  11. AcipHex 20 mg p.o. b.i.d.  12. Singulair 10 mg p.o. at bedtime.  13. Losartan 50 mg p.o. daily.  14. Avodart 0.5 mg p.o. daily. 15. Celebrex 200 mg p.o. daily as needed for arthritis symptoms.   ____________________________ Lynnea Ferrier, MD bjk:drc D: 04/22/2011 08:28:02 ET T: 04/23/2011 12:29:55 ET JOB#: [id number]  cc: Lynnea Ferrier, MD, <Dictator> Daniel Nones MD ELECTRONICALLY SIGNED 05/03/2011 12:53

## 2014-08-07 NOTE — Op Note (Signed)
PATIENT NAME:  Charles Berg, BISS MR#:  [id number] DATE OF BIRTH:  January 04, 1932  DATE OF PROCEDURE:  04/26/2011  PROCEDURE: Implantation of an implantable loop recorder.   SURGEON:  Anna Genre. Maisie Fus, MD.  INDICATION: Syncope and presyncope.   DETAILS OF PROCEDURE: The patient was brought to the Operating Room in a fasting, nonsedated state. The patient was prepped and draped in the usual sterile manner. Using local anesthesia 1% Xylocaine was injected into the implantation site. Using a scalpel, a one-inch incision was made. Using a combination of electrocautery and blunt dissection, a pocket was fashioned for the implantable loop recorder. The pocket was then cauterized to secure adequate hemostasis. The implantable loop recorder was then placed in the pocket. We then checked to make sure there was adequate sensing of both the atrium and ventricle after the device was implanted. This was confirmed. The pocket was then closed with running layers of 2-0 and 4-0 Vicryl. Adequate hemostasis was obtained. There were no complications noted at the conclusion of the procedure and estimated blood loss was minimal. The device implant was Reveal XT, model 9529, serial number [id number] implanted on 04/26/2011. The device was programmed as follows. The patient was educated on how to activate the device manually using his remote monitor. Additionally, there is auto recording that will take place. The device was set to record any fast beats greater than 176 beats per minute for at least 16 beats and for 231 beats per minute. Huston Foley detection was on to detect slow heart rates for rates greater than 30 beats per minute for four beats and a fib detection was set on to program all episodes where there were rapid atrial arrhythmias.   ____________________________ Anna Genre Maisie Fus, MD klt:ap D: 04/26/2011 14:09:07 ET T: 04/26/2011 16:14:28 ET JOB#: [id number]  cc: Caryn Bee L. Maisie Fus, MD, <Dictator>  Sharion Settler  MD ELECTRONICALLY SIGNED 05/17/2011 13:11

## 2014-10-01 IMAGING — CR DG CHEST 1V PORT
1 series · 1 of 1 positions shown · non-contrast
Comparison: 04/19/2011

CLINICAL DATA: Pacer placement

EXAM:
PORTABLE CHEST - 1 VIEW

[ap]
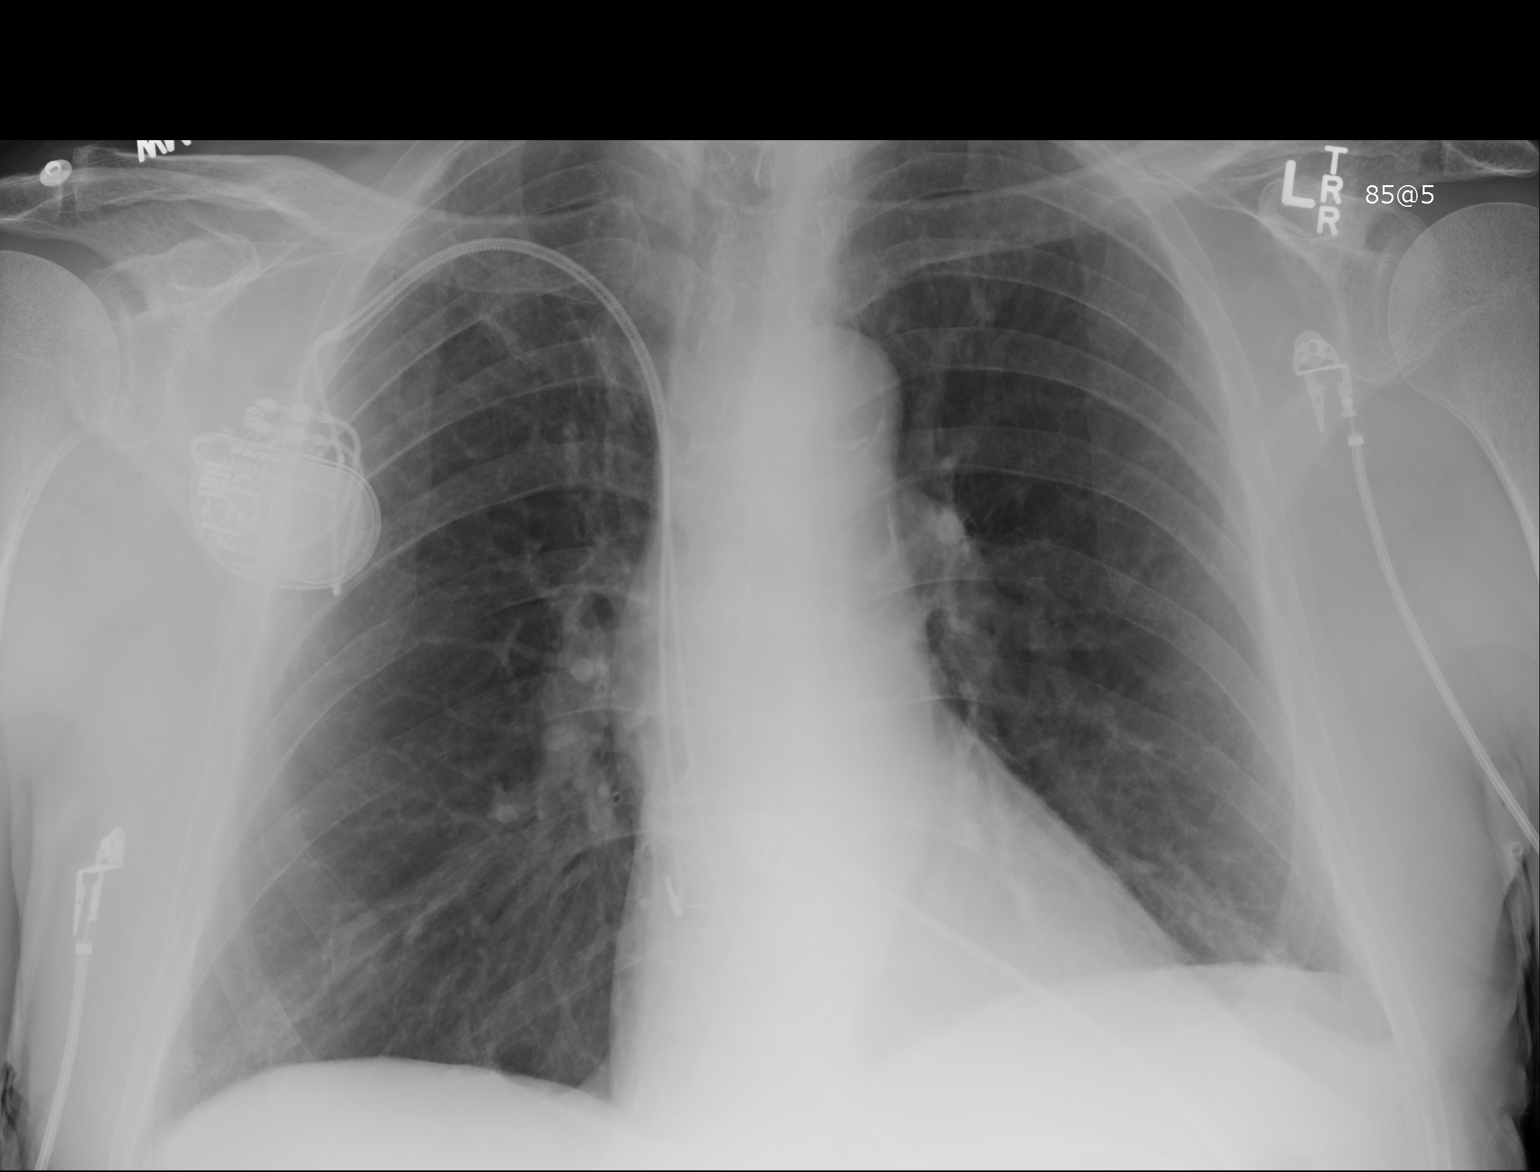

[1 of 1 positions shown; findings below may reference images not displayed]

FINDINGS: There is a right chest wall pacer device with lead in the right
atrial appendage and right ventricle. No pneumothorax identified
after pacer placement. The heart size appears normal. No pleural
effusion or edema. No airspace consolidation identified.
IMPRESSION: 1. No pneumothorax after pacer placement.

## 2019-06-14 DEATH — deceased
